# Patient Record
Sex: Female | Born: 1990
Health system: Southern US, Community
[De-identification: ages and names within clinical notes are randomized; demographics above are authoritative.]

## PROBLEM LIST (undated history)

## (undated) DIAGNOSIS — I10 Essential (primary) hypertension: Secondary | ICD-10-CM

## (undated) DIAGNOSIS — J45909 Unspecified asthma, uncomplicated: Secondary | ICD-10-CM

---

## 2018-12-25 ENCOUNTER — Other Ambulatory Visit: Payer: Self-pay | Admitting: Registered Nurse

## 2018-12-26 ENCOUNTER — Inpatient Hospital Stay (HOSPITAL_COMMUNITY)
Admission: AD | Admit: 2018-12-26 | Discharge: 2018-12-28 | DRG: 885 | Disposition: A | Discharge status: Home / Self Care | Payer: Medicaid Other | Source: Other Acute Inpatient Hospital | Attending: Psychiatry | Admitting: Psychiatry

## 2018-12-26 ENCOUNTER — Encounter (HOSPITAL_COMMUNITY): Payer: Self-pay

## 2018-12-26 ENCOUNTER — Other Ambulatory Visit: Payer: Self-pay

## 2018-12-26 ENCOUNTER — Other Ambulatory Visit: Payer: Self-pay | Admitting: Behavioral Health

## 2018-12-26 DIAGNOSIS — G4700 Insomnia, unspecified: Secondary | ICD-10-CM | POA: Diagnosis present

## 2018-12-26 DIAGNOSIS — I10 Essential (primary) hypertension: Secondary | ICD-10-CM | POA: Diagnosis present

## 2018-12-26 DIAGNOSIS — F17210 Nicotine dependence, cigarettes, uncomplicated: Secondary | ICD-10-CM | POA: Diagnosis present

## 2018-12-26 DIAGNOSIS — F200 Paranoid schizophrenia: Principal | ICD-10-CM | POA: Diagnosis present

## 2018-12-26 DIAGNOSIS — F259 Schizoaffective disorder, unspecified: Secondary | ICD-10-CM | POA: Diagnosis present

## 2018-12-26 MED ORDER — PANTOPRAZOLE SODIUM 40 MG PO TBEC
40.0000 mg | DELAYED_RELEASE_TABLET | Freq: Every day | ORAL | Status: DC
  Administered 2018-12-26 – 2018-12-28 (×3): 40 mg via ORAL
  Filled 2018-12-26 (×7): qty 1

## 2018-12-26 MED ORDER — RISPERIDONE 3 MG PO TABS
3.0000 mg | ORAL_TABLET | Freq: Two times a day (BID) | ORAL | Status: DC
  Administered 2018-12-26 – 2018-12-28 (×4): 3 mg via ORAL
  Filled 2018-12-26 (×10): qty 1

## 2018-12-26 MED ORDER — ZIPRASIDONE MESYLATE 20 MG IM SOLR
20.0000 mg | Freq: Once | INTRAMUSCULAR | Status: AC
  Administered 2018-12-26: 20 mg via INTRAMUSCULAR
  Filled 2018-12-26 (×2): qty 20

## 2018-12-26 MED ORDER — QUETIAPINE FUMARATE 100 MG PO TABS
100.0000 mg | ORAL_TABLET | Freq: Every day | ORAL | Status: DC
  Administered 2018-12-27: 100 mg via ORAL
  Filled 2018-12-26 (×4): qty 1

## 2018-12-26 MED ORDER — LORAZEPAM 2 MG/ML IJ SOLN
2.0000 mg | Freq: Once | INTRAMUSCULAR | Status: AC
  Administered 2018-12-26: 2 mg via INTRAMUSCULAR
  Filled 2018-12-26: qty 1

## 2018-12-26 MED ORDER — TEMAZEPAM 30 MG PO CAPS
30.0000 mg | ORAL_CAPSULE | Freq: Every day | ORAL | Status: DC
  Administered 2018-12-27: 30 mg via ORAL
  Filled 2018-12-26: qty 1

## 2018-12-26 MED ORDER — HYDROXYZINE HCL 25 MG PO TABS
25.0000 mg | ORAL_TABLET | ORAL | Status: DC | PRN
  Administered 2018-12-27: 25 mg via ORAL
  Filled 2018-12-26: qty 1

## 2018-12-26 MED ORDER — BENZTROPINE MESYLATE 0.5 MG PO TABS
0.5000 mg | ORAL_TABLET | Freq: Two times a day (BID) | ORAL | Status: DC
  Administered 2018-12-26 – 2018-12-28 (×4): 0.5 mg via ORAL
  Filled 2018-12-26 (×10): qty 1

## 2018-12-26 NOTE — [facility] Counselor (Signed)
CSW attempted to meet 1:1 with patient to complete PSA. Patient stated she was "done meeting with people and does not want anyone else to come in her room".

## 2018-12-26 NOTE — Tx Team (Signed)
Initial Treatment Plan 12/26/2018 2:58 PM Annette Hurley GYJ:[id number]    PATIENT STRESSORS: Substance abuse Traumatic event   PATIENT STRENGTHS: Average or above average intelligence Communication skills Supportive family/friends   PATIENT IDENTIFIED PROBLEMS: Hearing voices  paranoia  anxiety                 DISCHARGE CRITERIA:  Ability to meet basic life and health needs Improved stabilization in mood, thinking, and/or behavior Medical problems require only outpatient monitoring Motivation to continue treatment in a less acute level of care  PRELIMINARY DISCHARGE PLAN: Attend 12-step recovery group Outpatient therapy Return to previous living arrangement  PATIENT/FAMILY INVOLVEMENT: This treatment plan has been presented to and reviewed with the patient, Annette Hurley.  The patient and family have been given the opportunity to ask questions and make suggestions.  Baron Sane, RN 12/26/2018, 2:58 PM

## 2018-12-26 NOTE — H&P (Signed)
Psychiatric Admission Assessment Adult  Patient Identification: Annette Hurley  MRN:  [id number]  Date of Evaluation:  12/26/2018  Chief Complaint: Worsening paranoia.  Principal Diagnosis: Schizophrenia, paranoid ([place])  Diagnosis:  Principal Problem:   Schizophrenia, paranoid ([facility])  History of Present Illness: This is the first admission assessment in this [facility] for this 28 year old AA female with hx of chronic mental health issues. She  reports during this assessment that she is from [place] area prior to moving to [place]. Annette Hurley presents paranoid, suspicious, restricted affect & thought blocking episodes. During this assessment, [name] reports, "A couple of days ago or even yesterday, I ran to the hospital because I became paranoid. The reason was because I lied to my wife about a couple of things. She found out & said, "We can have a baby now". I have not been on my mental health medications in 7 months. I be feeling like I do not need the medicines sometime. I was on Geodon, Seroquel, acid reflux & Lorazepam. I will need my Seroquel to be able to sleep tonight & Geodon to help my thoughts. I take my medicines so that I can be normal. No trazodone.  Associated Signs/Symptoms:  Depression Symptoms:  depressed mood, insomnia, anxiety,  (Hypo) Manic Symptoms:  Labiality of Mood,  Anxiety Symptoms:  Excessive Worry,  Psychotic Symptoms:  Hallucinations: Auditory Visual Paranoia,  PTSD Symptoms: NA  Total Time spent with patient: 1 hour  Past Psychiatric History: Schizophrenia  Is the patient at risk to self? No.  Has the patient been a risk to self in the past 6 months? No.  Has the patient been a risk to self within the distant past? No.  Is the patient a risk to others? No.  Has the patient been a risk to others in the past 6 months? No.  Has the patient been a risk to others within the distant past? No.   Prior Inpatient Therapy: Yes (multiple times in [place]. Prior Outpatient Therapy: No  Alcohol Screening:    Substance Abuse History in the last 12 months:  Yes.    Consequences of Substance Abuse: Medical Consequences:  Liver damage, Possible death by overdose Legal Consequences:  Arrests, jail time, Loss of driving privilege. Family Consequences:  Family discord, divorce and or separation.  Previous Psychotropic Medications: Seroquel, Geodone, Klonopin  Psychological Evaluations: No   Past Medical History: History reviewed. No pertinent past medical history. History reviewed. No pertinent surgical history.  Family History: History reviewed. No pertinent family history.  Family Psychiatric  History: None reported  Tobacco Screening:    Social History:  Social History   Substance and Sexual Activity  Alcohol Use Not Currently     Social History   Substance and Sexual Activity  Drug Use Yes  . Types: Marijuana    Additional Social History:  Allergies:  Not on File Lab Results: No results found for this or any previous visit (from the past 48 hour(s)).  Blood Alcohol level:  No results found for: [facility]  Metabolic Disorder Labs:  No results found for: HGBA1C, MPG No results found for: PROLACTIN No results found for: CHOL, TRIG, HDL, CHOLHDL, VLDL, LDLCALC  Current Medications: No current facility-administered medications for this encounter.    PTA Medications: No medications prior to admission.   Musculoskeletal: Strength & Muscle Tone: within normal limits Gait & Station: normal Patient leans: N/A  Psychiatric Specialty Exam: Physical Exam  Vitals reviewed. Constitutional: She is  oriented to person, place, and time. She appears well-developed.  Eyes: Pupils are equal, round, and reactive to light.  Neck: Normal range of motion.  Cardiovascular: Normal rate.  Respiratory: Effort normal.  Genitourinary:    Genitourinary Comments: Deferred   Musculoskeletal: Normal range of motion.  Neurological:  She is alert and oriented to person, place, and time.  Skin: Skin is warm and dry.    Review of Systems  Constitutional: Negative for chills and fever.  Respiratory: Negative for cough, shortness of breath and wheezing.   Cardiovascular: Negative for chest pain and palpitations.  Gastrointestinal: Negative for heartburn, nausea and vomiting.  Neurological: Negative for dizziness and headaches.  Psychiatric/Behavioral: Positive for depression, hallucinations and substance abuse. Negative for memory loss and suicidal ideas. The patient is nervous/anxious and has insomnia.     Blood pressure 119/70, pulse 87, temperature 98 F (36.7 C), temperature source Oral, resp. rate 18, height 5\' 6"  (1.676 m), weight 90.7 kg, SpO2 100 %.Body mass index is 32.28 kg/m.  General Appearance: Fairly Groomed, presents paranoid  Eye Contact:  Fair  Speech:  Clear with some thought bloocking episodes  Volume:  Decreased  Mood:  Anxious and suspicious  Affect:  Blunt  Thought Process:  Disorganized and Descriptions of Associations: Tangential  Orientation:  Full (Time, Place, and Person)  Thought Content:  Hallucinations: Auditory Visual and Paranoid Ideation  Suicidal Thoughts:  Denies  Homicidal Thoughts:  Denies  Memory:  Immediate;   Fair Recent;   Fair Remote;   Fair  Judgement:  Impaired  Insight:  Lacking  Psychomotor Activity:  Decreased  Concentration:  Concentration: Fair and Attention Span: Fair  Recall:  [employer]  Fund of Knowledge:  Poor  Language:  Fair  Akathisia:  Negative  Handed:  Right  AIMS (if indicated):     Assets:  Desire for Improvement Physical Health Social Support  ADL's:  Intact  Cognition:  Impaired,  Mild  Sleep:      Treatment Plan Summary: Daily contact with patient to assess and evaluate symptoms and progress in treatment and Medication management.  Treatment Plan/Recommendations: 1. Admit for crisis management and stabilization, estimated length of stay 3-5  days.   2. Medication management to reduce current symptoms to base line and improve the patient's overall level of functioning: See MAR, Md's SRA & treatment plan.   Observation Level/Precautions:  15 minute checks  Laboratory:  Per ED  Psychotherapy: Group sessions  [facility]   Consultations: As needed.   Discharge Concerns:  Safety, mood stability.  Estimated LOS: 5-7 days  Other:  Admit to the [facility].   Physician Treatment Plan for Primary Diagnosis: Schizophrenia, paranoid (HCC)  Long Term Goal(s): Improvement in symptoms so as ready for discharge  Short Term Goals: Ability to identify changes in lifestyle to reduce recurrence of condition will improve, Ability to verbalize feelings will improve and Ability to demonstrate self-control will improve  Physician Treatment Plan for Secondary Diagnosis: Principal Problem:   Schizophrenia, paranoid (HCC)  Long Term Goal(s): Improvement in symptoms so as ready for discharge  Short Term Goals: Ability to identify and develop effective coping behaviors will improve, Compliance with prescribed medications will improve and Ability to identify triggers associated with substance abuse/mental health issues will improve  I certify that inpatient services furnished can reasonably be expected to improve the patient's condition.    Armandina StammerAgnes Nwoko, NP, PMHNP, FNP-BC 9/16/20203:25 PM

## 2018-12-26 NOTE — Progress Notes (Signed)
Pts wife came to visit on the request of the patient.  Pt did not give her the code number when on the phone earlier.  Writer talked to pt and she reported not wanting a visitor and she will call her when she is feeling better.  Writer asked if it was ok for staff to relay that information and she stated yes but did not agree to give her the code number.  Nurse Maretta Los was a witness with this Probation officer and heard pt state it was ok for staff to tell her wife that she will call when she is feeling better.  Relayed information to [place], wife, and she was upset and cursing while leaving the lobby.
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
## 2018-12-26 NOTE — Progress Notes (Signed)
Adult Psychoeducational Group Note  Date:  12/26/2018 Time:  8:33 PM  Group Topic/Focus:  Wrap-Up Group:   The focus of this group is to help patients review their daily goal of treatment and discuss progress on daily workbooks.  Participation Level:  Did Not Attend  Participation Quality:  Did not attend  Affect:  Did not attend  Cognitive:  Did not attend  Insight: None  Engagement in Group:  Did not attend  Modes of Intervention:  Did not attend  Additional Comments:  Patient did not attend wrap up group tonight.   Annette Hurley L Persephanie Laatsch 12/26/2018, 8:33 PM

## 2018-12-26 NOTE — Progress Notes (Signed)
Skin Assessment:  Patient has tattoo across upper chest, R upper, lower arm and R hand.    Old healed scars on L knee and legs.  New abrasion on R knee.

## 2018-12-27 NOTE — Progress Notes (Signed)
D: Pt denies SI/HI/VH, +ve AH- better. Pt is pleasant and cooperative. Pt visible on the unit a little while and interactive and did talk with Probation officer about her situation. Pt talked about moving to [place] from [place] to get fresh start and try reduce possible issues with life.  A: Pt was offered support and encouragement. Pt was given scheduled medications. Pt was encourage to attend groups. Q 15 minute checks were done for safety.  R:Pt attends groups and interacts  with peers and staff minimally . Pt is taking medication. Pt has no complaints.Pt receptive to treatment and safety maintained on unit.

## 2018-12-27 NOTE — Progress Notes (Signed)
Patient ID: Annette Hurley, female   DOB: Dec 27, 1990, 28 y.o.   MRN: [id number]  [place] NOVEL CORONAVIRUS (COVID-19) DAILY CHECK-OFF SYMPTOMS - answer yes or no to each - every day NO YES  Have you had a fever in the past 24 hours?  . Fever (Temp > 37.80C / 100F) X   Have you had any of these symptoms in the past 24 hours? . New Cough .  Sore Throat  .  Shortness of Breath .  Difficulty Breathing .  Unexplained Body Aches   X   Have you had any one of these symptoms in the past 24 hours not related to allergies?   . Runny Nose .  Nasal Congestion .  Sneezing   X   If you have had runny nose, nasal congestion, sneezing in the past 24 hours, has it worsened?  X   EXPOSURES - check yes or no X   Have you traveled outside the state in the past 14 days?  X   Have you been in contact with someone with a confirmed diagnosis of COVID-19 or PUI in the past 14 days without wearing appropriate PPE?  X   Have you been living in the same home as a person with confirmed diagnosis of COVID-19 or a PUI (household contact)?    X   Have you been diagnosed with COVID-19?    X              What to do next: Answered NO to all: Answered YES to anything:   Proceed with unit schedule Follow the [facility] Inpatient Flowsheet.

## 2018-12-27 NOTE — [facility] Suicide Risk Assessment (Signed)
[place] INPATIENT:  Family/Significant Other Suicide Prevention Education  Suicide Prevention Education:  Patient Refusal for Family/Significant Other Suicide Prevention Education: The patient Annette Hurley has refused to provide written consent for family/significant other to be provided Family/Significant Other Suicide Prevention Education during admission and/or prior to discharge.  Physician notified.  Joellen Jersey 12/27/2018, 11:22 AM

## 2018-12-27 NOTE — BHH Counselor (Signed)
Adult Comprehensive Assessment  Patient ID: Annette Hurley, female   DOB: November 13, 1990, 28 y.o.   MRN: [id number]  Information Source: Information source: Patient  Current Stressors:  Patient states their primary concerns and needs for treatment are:: "I have no idea" Patient presented under IVC, hearing voices. Patient states their goals for this hospitilization and ongoing recovery are:: "Go home," but she is not sure where she will be living at discharge. Educational / Learning stressors: Denies Employment / Job issues: Not working, she wasn't able to recall or provide employment history Family Relationships: Denies stressors, reports she will call her mother later today. She has a large family. Financial / Lack of resources (include bankruptcy): Not employed, has Medicaid. Housing / Lack of housing: Difficult to assess, was living with wife prior to admission, but she says she doesn't know if she can go home Physical health (include injuries & life threatening diseases): "Stomach ulcers." Social relationships: Married, but she isn't sure if her wife will let her come home. Substance abuse: "I don't do alcohol." Chart use indicates THC use. Bereavement / Loss: Denies  Living/Environment/Situation:  Living Arrangements: Spouse/significant other Living conditions (as described by patient or guardian): [place], [place] Who else lives in the home?: Wife How long has patient lived in current situation?: "Since late 2018." What is atmosphere in current home: (Unable to assess)  Family History:  Marital status: Married Number of Years Married: 1(Married November 2019) What types of issues is patient dealing with in the relationship?: Difficult to assess Additional relationship information: Not sure her wife will let her come home Are you sexually active?: Yes What is your sexual orientation?: didn't answer Has your sexual activity been affected by drugs, alcohol,  medication, or emotional stress?: didn't answer Does patient have children?: No  Childhood History:  By whom was/is the patient raised?: Other (Comment)("My whole family") Additional childhood history information: Reports she is from [place], reports she has a large family and could not specify how many siblings. Hasn't seen dad since she was 28 years old Description of patient's relationship with caregiver when they were a child: "Fine." Patient's description of current relationship with people who raised him/her: Both parents living, okay with mother, doesn't really talk to dad How were you disciplined when you got in trouble as a child/adolescent?: didn't answer Does patient have siblings?: Yes Description of patient's current relationship with siblings: "A lot of brothers and sisters." Did patient suffer any verbal/emotional/physical/sexual abuse as a child?: (didn't answer) Did patient suffer from severe childhood neglect?: (didn't answer) Has patient ever been sexually abused/assaulted/raped as an adolescent or adult?: (didn't answer) Was the patient ever a victim of a crime or a disaster?: (didn't answer) Witnessed domestic violence?: (didn't answer) Has patient been effected by domestic violence as an adult?: (didn't answer)  Education:  Highest grade of school patient has completed: 11th grade Currently a student?: No Learning disability?: No  Employment/Work Situation:   Employment situation: Unemployed What is the longest time patient has a held a job?: didn't answer Where was the patient employed at that time?: didn't answer Did You Receive Any Psychiatric Treatment/Services While in the [place] No Are There Guns or Other Weapons in Your Home?: No  Financial Resources:   Surveyor, quantityinancial resources: No income, Medicaid, Income from spouse Does patient have a Lawyerrepresentative payee or guardian?: No  Alcohol/Substance Abuse:   What has been your use of drugs/alcohol within the last  12 months?: THC Alcohol/Substance Abuse Treatment Hx:  Past Tx, Inpatient If yes, describe treatment: Prior mental health hospitalizations out of state, did not recall facility name or dates Has alcohol/substance abuse ever caused legal problems?: No  Social Support System:   Patient's Community Support System: Fair Astronomer System: Wife, mother Type of faith/religion: "I was raised Muslim." How does patient's faith help to cope with current illness?: unsure  Leisure/Recreation:   Leisure and Hobbies: "Just chill, laugh."  Strengths/Needs:   What is the patient's perception of their strengths?: "I like to eat." Patient states they can use these personal strengths during their treatment to contribute to their recovery: unsure  Discharge Plan:   Currently receiving community mental health services: No Patient states concerns and preferences for aftercare planning are: She is interested in outpatient therapy and medication management, but she isn't sure if she will return home with her wife or go live with her mother. Patient states they will know when they are safe and ready for discharge when: Not sure, she asked about leaving and criteria for discharge. Does patient have access to transportation?: No(Family may be able to transport her.) Does patient have financial barriers related to discharge medications?: No Patient description of barriers related to discharge medications: Has Medicaid. Plan for no access to transportation at discharge: Public transportation, Lyft/[employer] Will patient be returning to same living situation after discharge?: (Uncertain if she will return home with or wife or go live with her mom.)  Summary/Recommendations:   Summary and Recommendations (to be completed by the evaluator): Annette Hurley is a 28 year old female from [place], who presents to [facility] under IVC. IVC states patient was hearing voices. Patient states she is unsure why she is  hospitalized. Patient is a poor historian and presents as flat and guarded during the assessment. She provides short answers and cannot recall much information. She states she has previous hosptializations from out of state, but cannot recall when. She is receptive to being referred for outpatient therapy and medication management. Patient will benefit from crisis stabilization, medication management, therapeutic milieu, and referrals for services.  Joellen [place]. 12/27/2018

## 2018-12-27 NOTE — Progress Notes (Signed)
[facility]  12/27/2018 7:48 AM Annette Hurley  MRN:  [id number] Subjective:    Patient remains guarded, paranoid, and irritable and does not provide further new information.  She minimally answers the questions of the basic mental status exam.  States she did sleep "okay" denies auditory or visual hallucinations continues to behave in a paranoid fashion.  No involuntary movements. Still cites vague psychosocial stressors with partner but otherwise does not give me further information as to why she is here  Principal Problem: Schizophrenia, paranoid (HCC) Diagnosis: Principal Problem:   Schizophrenia, paranoid (HCC) Active Problems:   Schizoaffective disorder (HCC)  Total Time spent with patient: 20 minutes  Past Psychiatric History: No prior admissions here and reports numerous admissions in [place]  Past Medical History: History reviewed. No pertinent past medical history. History reviewed. No pertinent surgical history. Family History: History reviewed. No pertinent family history. Family Psychiatric  History: Did not answer Social History:  Social History   Substance and Sexual Activity  Alcohol Use Not Currently     Social History   Substance and Sexual Activity  Drug Use Yes  . Types: Marijuana    Social History   Socioeconomic History  . Marital status: Married    Spouse name: Not on file  . Number of children: Not on file  . Years of education: Not on file  . Highest education level: Not on file  Occupational History  . Not on file  Social Needs  . Financial resource strain: Not on file  . Food insecurity    Worry: Not on file    Inability: Not on file  . Transportation needs    Medical: Not on file    Non-medical: Not on file  Tobacco Use  . Smoking status: Current Every Day Smoker    Packs/day: 1.00    Types: Cigarettes  . Smokeless tobacco: Current User  Substance and Sexual Activity  . Alcohol use: Not Currently  . Drug use:  Yes    Types: Marijuana  . Sexual activity: Yes    Birth control/protection: None  Lifestyle  . Physical activity    Days per week: Not on file    Minutes per session: Not on file  . Stress: Not on file  Relationships  . Social Musicianconnections    Talks on phone: Not on file    Gets together: Not on file    Attends religious service: Not on file    Active member of club or organization: Not on file    Attends meetings of clubs or organizations: Not on file    Relationship status: Not on file  Other Topics Concern  . Not on file  Social History Narrative  . Not on file   Additional Social History:                         Sleep: Fair  Appetite:  Fair  Current Medications: Current Facility-Administered Medications  Medication Dose Route Frequency Provider Last Rate Last Dose  . benztropine (COGENTIN) tablet 0.5 mg  0.5 mg Oral BID Malvin JohnsFarah, Kallista Pae, MD   0.5 mg at 12/26/18 1800  . hydrOXYzine (ATARAX/VISTARIL) tablet 25 mg  25 mg Oral Q4H PRN Nwoko, Agnes I, NP      . pantoprazole (PROTONIX) EC tablet 40 mg  40 mg Oral Daily Nwoko, Agnes I, NP   40 mg at 12/26/18 1800  . QUEtiapine (SEROQUEL) tablet 100 mg  100 mg  Oral QHS Lindell Spar I, NP      . risperiDONE (RISPERDAL) tablet 3 mg  3 mg Oral BID Johnn Hai, MD   3 mg at 12/26/18 1800  . temazepam (RESTORIL) capsule 30 mg  30 mg Oral QHS Nwoko, Agnes I, NP        Lab Results: No results found for this or any previous visit (from the past 48 hour(s)).  Blood Alcohol level:  No results found for: [facility]  Metabolic Disorder Labs: No results found for: HGBA1C, MPG No results found for: PROLACTIN No results found for: CHOL, TRIG, HDL, CHOLHDL, VLDL, LDLCALC  Physical Findings: AIMS: Facial and Oral Movements Muscles of Facial Expression: None, normal Lips and Perioral Area: None, normal Jaw: None, normal Tongue: None, normal,Extremity Movements Upper (arms, wrists, hands, fingers): None, normal Lower (legs, knees,  ankles, toes): None, normal, Trunk Movements Neck, shoulders, hips: None, normal, Overall Severity Severity of abnormal movements (highest score from questions above): None, normal Incapacitation due to abnormal movements: None, normal Patient's awareness of abnormal movements (rate only patient's report): No Awareness, Dental Status Current problems with teeth and/or dentures?: No Does patient usually wear dentures?: No  CIWA:    COWS:     Musculoskeletal: Strength & Muscle Tone: within normal limits Gait & Station: normal Patient leans: N/A  Psychiatric Specialty Exam: Physical Exam  ROS  Blood pressure 119/70, pulse 87, temperature 98 F (36.7 C), temperature source Oral, resp. rate 18, height 5\' 6"  (1.676 m), weight 90.7 kg, SpO2 100 %.Body mass index is 32.28 kg/m.  General Appearance: Casual  Eye Contact:  None  Speech:  Slow  Volume:  Decreased  Mood:  Anxious and Dysphoric  Affect:  Blunt and Constricted  Thought Process:  Linear and Descriptions of Associations: Circumstantial  Orientation:  Full (Time, Place, and Person)  Thought Content:  Paranoid Ideation  Suicidal Thoughts:  No  Homicidal Thoughts:  No  Memory:  Immediate;   Poor Recent;   Fair  Judgement:  Poor  Insight:  Lacking and Shallow  Psychomotor Activity:  Decreased  Concentration:  Concentration: Poor  Recall:  Poor  Fund of Knowledge:  Poor  Language:  Poor  Akathisia:  Negative  Handed:  Right  AIMS (if indicated):     Assets:  Physical Health Resilience  ADL's:  Intact  Cognition:  WNL  Sleep:  Number of Hours: 6.75     Treatment Plan Summary: Daily contact with patient to assess and evaluate symptoms and progress in treatment and Medication management  For psychotic disorder continue reality based therapy and to continue risperidone therapy she did not speak enthusiastically about Geodon success so we will avoid that 1.  Continue to monitor under 15-minute precautions she will not  answer as to whether she plans to stay in this area or go back to [place]  Annette Welling, MD 12/27/2018, 7:48 AM

## 2018-12-27 NOTE — Progress Notes (Signed)
Recreation Therapy Notes  Date:  9.17.20 Time: 1000 Location: 500 Hall Dayroom  Group Topic: Coping Skills  Goal Area(s) Addresses:  Patient will identify positive coping skills. Patient will identify barriers to using positive coping skills.  Intervention:  Worksheet, pencils  Activity: Healthy vs. Unhealthy Coping Strategies.  Patient were to identify unhealthy coping strategies and consequences to those strategies.  Patients would then identify positive coping strategies, outcomes and barriers to using those positive coping strategies.  Education:Coping Skills, Discharge Planning.   Education Outcome: Acknowledges understanding/In group clarification offered/Needs additional education.   Clinical Observations/Feedback:  Pt did not attend group.       [name], LRT/CTRS         [name] A 12/27/2018 12:27 PM 

## 2018-12-27 NOTE — Plan of Care (Signed)
  Problem: Education: Goal: Emotional status will improve Outcome: Progressing Goal: Verbalization of understanding the information provided will improve Outcome: Progressing   Problem: Coping: Goal: Ability to verbalize frustrations and anger appropriately will improve Outcome: Progressing Goal: Ability to demonstrate self-control will improve Outcome: Progressing   

## 2018-12-27 NOTE — Progress Notes (Signed)
Recreation Therapy Notes  INPATIENT RECREATION THERAPY ASSESSMENT  Patient Details Name: Annette Hurley MRN: [id number] DOB: Apr 27, 1990 Today's Date: 12/27/2018       Information Obtained From: Patient  Able to Participate in Assessment/Interview: Yes  Patient Presentation: Alert  Reason for Admission (Per Patient): Patient Unable to Identify  Patient Stressors: (None identified)  Coping Skills:   TV, Sports, Music, Deep Breathing, Meditate, Substance Abuse, Art, Avoidance, Read, Hot Bath/Shower  Leisure Interests (2+):  Social - Friends, Individual - Other (Comment)(Chill)  Frequency of Recreation/Participation: Other (Comment)(Daily)  Awareness of Community Resources:  Yes  Community Resources:  Park, Patent examiner, Art therapist  Current Use: Yes  If no, Barriers?:    Expressed Interest in [place]: No  [employer] of Residence:  Lobbyist  Patient Main Form of Transportation: Musician  Patient Strengths:  Conservator, museum/gallery self  Patient Identified Areas of Improvement:  "I don't know"  Patient Goal for Hospitalization:  "go to groups so I can go home"  Current SI (including self-harm):  No  Current HI:  No  Current AVH: No  Staff Intervention Plan: Group Attendance, Collaborate with Interdisciplinary Treatment Team  Consent to Intern Participation: N/A    Victorino Sparrow, LRT/CTRS  Victorino Sparrow A 12/27/2018, 12:33 PM

## 2018-12-27 NOTE — BHH Group Notes (Signed)
LCSW Aftercare Discharge Planning Group Note  12/27/2018 8:45am  Type of Group and Topic: Psychoeducational Group: Discharge Planning  Participation Level: Did Not Attend  Description of Group  Discharge planning group reviews patient's anticipated discharge plans and assists patients to anticipate and address any barriers to wellness/recovery in the community. Suicide prevention education is reviewed with patients in group.  Therapeutic Goals  1. Patients will state their anticipated discharge plan and mental health aftercare  2. Patients will identify potential barriers to wellness in the community setting  3. Patients will engage in problem solving, solution focused discussion of ways to anticipate and address barriers to wellness/recovery  Summary of Patient Progress  Plan for Discharge/Comments:  Transportation Means:  Supports:  Therapeutic Modalities:  [place], [place]  12/27/2018 2:37 PM

## 2018-12-28 MED ORDER — RISPERIDONE 3 MG PO TABS: 3.0000 mg | ORAL_TABLET | Freq: Two times a day (BID) | ORAL | 2 refills | Status: DC

## 2018-12-28 MED ORDER — AMLODIPINE BESYLATE 10 MG PO TABS: 10.0000 mg | ORAL_TABLET | Freq: Every day | ORAL | 1 refills | Status: DC

## 2018-12-28 MED ORDER — QUETIAPINE FUMARATE 100 MG PO TABS: 100.0000 mg | ORAL_TABLET | Freq: Every day | ORAL | 1 refills | Status: DC

## 2018-12-28 MED ORDER — BENZTROPINE MESYLATE 0.5 MG PO TABS: 0.5000 mg | ORAL_TABLET | Freq: Two times a day (BID) | ORAL | 2 refills | Status: DC

## 2018-12-28 MED ORDER — AMLODIPINE BESYLATE 10 MG PO TABS
10.0000 mg | ORAL_TABLET | Freq: Every day | ORAL | Status: DC
  Administered 2018-12-28: 10 mg via ORAL
  Filled 2018-12-28: qty 2
  Filled 2018-12-28 (×3): qty 1

## 2018-12-28 NOTE — Tx Team (Signed)
Interdisciplinary Treatment and Diagnostic Plan Update  12/28/2018 Time of Session: 9:00am Annette Hurley MRN: [id number]  Principal Diagnosis: Schizophrenia, paranoid ([place])  Secondary Diagnoses: Principal Problem:   Schizophrenia, paranoid ([place]) Active Problems:   Schizoaffective disorder ([place])   Current Medications:  Current Facility-Administered Medications  Medication Dose Route Frequency Provider Last Rate Last Dose  . amLODipine (NORVASC) tablet 10 mg  10 mg Oral Daily Johnn Hai, MD   10 mg at 12/28/18 0802  . benztropine (COGENTIN) tablet 0.5 mg  0.5 mg Oral BID Johnn Hai, MD   0.5 mg at 12/28/18 0757  . hydrOXYzine (ATARAX/VISTARIL) tablet 25 mg  25 mg Oral Q4H PRN Lindell Spar I, NP   25 mg at 12/27/18 1122  . pantoprazole (PROTONIX) EC tablet 40 mg  40 mg Oral Daily Lindell Spar I, NP   40 mg at 12/28/18 0757  . QUEtiapine (SEROQUEL) tablet 100 mg  100 mg Oral QHS Lindell Spar I, NP   100 mg at 12/27/18 2102  . risperiDONE (RISPERDAL) tablet 3 mg  3 mg Oral BID Johnn Hai, MD   3 mg at 12/28/18 0757  . temazepam (RESTORIL) capsule 30 mg  30 mg Oral QHS Nwoko, Herbert Pun I, NP   30 mg at 12/27/18 2102   PTA Medications: Medications Prior to Admission  Medication Sig Dispense Refill Last Dose  . LORAZEPAM PO Take by mouth. Dose and frequency unknown     . PANTOPRAZOLE SODIUM PO Take by mouth. Dose and frequency unknown     . QUEtiapine Fumarate (SEROQUEL PO) Take by mouth. Dose and frequency unknown     . ZIPRASIDONE HCL PO Take by mouth. Dose and frequency unknown       Patient Stressors: Substance abuse Traumatic event  Patient Strengths: Average or above average intelligence Communication skills Supportive family/friends  Treatment Modalities: Medication Management, Group therapy, Case management,  1 to 1 session with clinician, Psychoeducation, Recreational therapy.   Physician Treatment Plan for Primary Diagnosis: Schizophrenia, paranoid ([place]) Long  Term Goal(s): Improvement in symptoms so as ready for discharge Improvement in symptoms so as ready for discharge   Short Term Goals: Ability to identify changes in lifestyle to reduce recurrence of condition will improve Ability to verbalize feelings will improve Ability to demonstrate self-control will improve Ability to identify and develop effective coping behaviors will improve Compliance with prescribed medications will improve Ability to identify triggers associated with substance abuse/mental health issues will improve  Medication Management: Evaluate patient's response, side effects, and tolerance of medication regimen.  Therapeutic Interventions: 1 to 1 sessions, Unit Group sessions and Medication administration.  Evaluation of Outcomes: Adequate for Discharge  Physician Treatment Plan for Secondary Diagnosis: Principal Problem:   Schizophrenia, paranoid ([place]) Active Problems:   Schizoaffective disorder ([place])  Long Term Goal(s): Improvement in symptoms so as ready for discharge Improvement in symptoms so as ready for discharge   Short Term Goals: Ability to identify changes in lifestyle to reduce recurrence of condition will improve Ability to verbalize feelings will improve Ability to demonstrate self-control will improve Ability to identify and develop effective coping behaviors will improve Compliance with prescribed medications will improve Ability to identify triggers associated with substance abuse/mental health issues will improve     Medication Management: Evaluate patient's response, side effects, and tolerance of medication regimen.  Therapeutic Interventions: 1 to 1 sessions, Unit Group sessions and Medication administration.  Evaluation of Outcomes: Adequate for Discharge   RN Treatment Plan for Primary Diagnosis: Schizophrenia, paranoid (  HCC) Long Term Goal(s): Knowledge of disease and therapeutic regimen to maintain health will improve  Short Term  Goals: Ability to verbalize frustration and anger appropriately will improve, Ability to verbalize feelings will improve, Ability to identify and develop effective coping behaviors will improve and Compliance with prescribed medications will improve  Medication Management: RN will administer medications as ordered by provider, will assess and evaluate patient's response and provide education to patient for prescribed medication. RN will report any adverse and/or side effects to prescribing provider.  Therapeutic Interventions: 1 on 1 counseling sessions, Psychoeducation, Medication administration, Evaluate responses to treatment, Monitor vital signs and CBGs as ordered, Perform/monitor CIWA, COWS, AIMS and Fall Risk screenings as ordered, Perform wound care treatments as ordered.  Evaluation of Outcomes: Adequate for Discharge   LCSW Treatment Plan for Primary Diagnosis: Schizophrenia, paranoid (HCC) Long Term Goal(s): Safe transition to appropriate next level of care at discharge, Engage patient in therapeutic group addressing interpersonal concerns.  Short Term Goals: Engage patient in aftercare planning with referrals and resources, Increase social support, Increase emotional regulation, Identify triggers associated with mental health/substance abuse issues and Increase skills for wellness and recovery  Therapeutic Interventions: Assess for all discharge needs, 1 to 1 time with Social worker, Explore available resources and support systems, Assess for adequacy in community support network, Educate family and significant other(s) on suicide prevention, Complete Psychosocial Assessment, Interpersonal group therapy.  Evaluation of Outcomes: Adequate for Discharge   Progress in Treatment: Attending groups: No. Participating in groups: No. Taking medication as prescribed: Yes. Toleration medication: Yes. Family/Significant other contact made: No, will contact:  declined consents, SPE reviewed  with patient. Patient understands diagnosis: Yes. Discussing patient identified problems/goals with staff: No. Medical problems stabilized or resolved: Yes. Denies suicidal/homicidal ideation: Yes. Issues/concerns per patient self-inventory: No.   New problem(s) identified: Yes, Describe:  relationship conflict  New Short Term/Long Term Goal(s): medication management for mood stabilization; elimination of SI thoughts; development of comprehensive mental wellness/sobriety plan.  Patient Goals: "Go home."  Discharge Plan or Barriers: Returning home, [facility]  Reason for Continuation of Hospitalization: Anxiety Medication stabilization  Estimated Length of Stay: discharging today  Attendees: Patient: Annette Hurley 12/28/2018 9:30 AM  Physician: Dr.Farah 12/28/2018 9:30 AM  Nursing: Annette KickJan, RN 12/28/2018 9:30 AM  RN Care Manager: 12/28/2018 9:30 AM  Social Worker: Annette Hurley, LCSWA 12/28/2018 9:30 AM  Recreational Therapist:  12/28/2018 9:30 AM  Other:  12/28/2018 9:30 AM  Other:  12/28/2018 9:30 AM  Other: 12/28/2018 9:30 AM    Scribe for Treatment Team: Annette Hurley, LCSWA 12/28/2018 9:30 AM

## 2018-12-28 NOTE — Progress Notes (Signed)
  [facility] Adult Case Management Discharge Plan :  Will you be returning to the same living situation after discharge:  Yes,  home At discharge, do you have transportation home?: Yes,  wife picking up Do you have the ability to pay for your medications: Yes,  Medicaid.  Release of information consent forms completed and in the chart. Patient to Follow up at: Follow-up [place] Follow up on 12/31/2018.   Why: Hospital discharge appointment is Monday, 9/21 at 8:15a.  Please bring your photo ID, insurance card, and current medications.  Contact information: [place] [address] [phone]           Next level of care provider has access to [place] and Suicide Prevention discussed: Yes,  with patient.   Has patient been referred to the Quitline?: Patient refused referral  Patient has been referred for addiction treatment: Yes  Joellen [place], [place] 12/28/2018, 11:08 AM

## 2018-12-28 NOTE — B[facility] Suicide Risk Assessment (Signed)
[facility] Discharge Suicide Risk Assessment   Principal Problem: Schizophrenia, paranoid ([place]) Discharge Diagnoses: Principal Problem:   Schizophrenia, paranoid ([place]) Active Problems:   Schizoaffective disorder ([place])   Total Time spent with patient: 45 minutes  Musculoskeletal: Strength & Muscle Tone: within normal limits Gait & Station: normal Patient leans: N/A  Psychiatric Specialty Exam: ROS  Blood pressure (!) 149/138, pulse (!) 137, temperature 97.7 F (36.5 C), temperature source Oral, resp. rate 18, height 5\' 6"  (1.676 m), weight 90.7 kg, SpO2 100 %.Body mass index is 32.28 kg/m.  General Appearance: Casual  Eye Contact::  Good  Speech:  Clear and Coherent409  Volume:  Normal  Mood:  Euthymic  Affect:  Constricted  Thought Process:  Coherent and Descriptions of Associations: Circumstantial  Orientation:  Full (Time, Place, and Person)  Thought Content:  Logical  Suicidal Thoughts:  No  Homicidal Thoughts:  No  Memory:  Immediate;   Good Recent;   Good  Judgement:  Good  Insight:  Good  Psychomotor Activity:  Normal  Concentration:  Good  Recall:  Good  Fund of Knowledge:Good  Language: Good  Akathisia:  Negative  Handed:  Right  AIMS (if indicated):     Assets:  Communication Skills Desire for Improvement  Sleep:  Number of Hours: 6.5  Cognition: WNL  ADL's:  Intact   Mental Status Per Nursing Assessment::   On Admission:  NA  Demographic Factors:  Gay, lesbian, or bisexual orientation  Loss Factors: Decrease in vocational status  Historical Factors: NA  Risk Reduction Factors:   Living with another person, especially a relative and Positive social support  Continued Clinical Symptoms:  Previous Psychiatric Diagnoses and Treatments  Cognitive Features That Contribute To Risk:  None    Suicide Risk:  Minimal: No identifiable suicidal ideation.  Patients presenting with no risk factors but with morbid ruminations; may be classified as minimal  risk based on the severity of the depressive symptoms    Plan Of Care/Follow-up recommendations:  Activity:  full  Amarii Bordas, MD 12/28/2018, 7:55 AM

## 2018-12-28 NOTE — Discharge Summary (Signed)
Physician Discharge Summary Note  Patient:  Annette Hurley is an 28 y.o., female MRN:  [id number] DOB:  1990/11/17 Patient phone:  [phone] (home)  Patient address:   [address] [place] [address],  Total Time spent with patient: 45 minutes  Date of Admission:  12/26/2018 Date of Discharge: 12/28/2018  Reason for Admission:    This was the first admission here but the latest of numerous for Annette Hurley 28 year old individual who reported that she had been off of her psychotropic medications for about 6 months, she present with paranoia, guarded behavior poor cooperation and irritability, according to admission note  History of Present Illness: This is the first admission assessment in this [facility] for this 28 year old AA female with hx of chronic mental health issues. She  reports during this assessment that she is from [place] area prior to moving to [place]. Annette Hurley presents paranoid, suspicious, restricted affect & thought blocking episodes. During this assessment, Annette Hurley reports, "A couple of days ago or even yesterday, I ran to the hospital because I became paranoid. The reason was because I lied to my wife about a couple of things. She found out & said, "We can have a baby now". I have not been on my mental health medications in 7 months. I be feeling like I do not need the medicines sometime. I was on Geodon, Seroquel, acid reflux & Lorazepam. I will need my Seroquel to be able to sleep tonight & Geodon to help my thoughts. I take my medicines so that I can be normal. No trazodone  Principal Problem: Schizophrenia, paranoid [address]) Discharge Diagnoses: Principal Problem:   Schizophrenia, paranoid ([place]) Active Problems:   Schizoaffective disorder ([place])   Past Psychiatric History: Reported multiple hospitalizations in [place]  Past Medical History: History reviewed. No pertinent past medical history. History reviewed. No pertinent surgical history. Family History:  History reviewed. No pertinent family history. Family Psychiatric  History: Denied -reported her mother was schizophrenic Social History:  Social History   Substance and Sexual Activity  Alcohol Use Not Currently     Social History   Substance and Sexual Activity  Drug Use Yes  . Types: Marijuana    Social History   Socioeconomic History  . Marital status: Married    Spouse name: Not on file  . Number of children: Not on file  . Years of education: Not on file  . Highest education level: Not on file  Occupational History  . Not on file  Social Needs  . Financial resource strain: Not on file  . Food insecurity    Worry: Not on file    Inability: Not on file  . Transportation needs    Medical: Not on file    Non-medical: Not on file  Tobacco Use  . Smoking status: Current Every Day Smoker    Packs/day: 1.00    Types: Cigarettes  . Smokeless tobacco: Current User  Substance and Sexual Activity  . Alcohol use: Not Currently  . Drug use: Yes    Types: Marijuana  . Sexual activity: Yes    Birth control/protection: None  Lifestyle  . Physical activity    Days per week: Not on file    Minutes per session: Not on file  . Stress: Not on file  Relationships  . Social Herbalist on phone: Not on file    Gets together: Not on file    Attends religious service: Not on file  Active member of club or organization: Not on file    Attends meetings of clubs or organizations: Not on file    Relationship status: Not on file  Other Topics Concern  . Not on file  Social History Narrative  . Not on file    Hospital Course:    As discussed patient was initially guarded somewhat irritable and less cooperative than optimal but she displayed no dangerous behaviors here.  She tended to stay to herself.  She was continued on quetiapine at her request but we felt that Geodon was not going to be efficacious and gave her Risperdal, and it was a pretty standard dose she had  no EPS or TD.  By the 18th she was much improved she can make eye contact was conversant denied auditory and visual hallucinations, denied thoughts of harming self or others and stated she felt like she was at her baseline status she had spoken with her partner the night before.  She felt she was ready to go home so I could not argue with her she was pretty clear as far as mental status exam without any acute positive symptoms. No acute dangerousness  Physical Findings: AIMS: Facial and Oral Movements Muscles of Facial Expression: None, normal Lips and Perioral Area: None, normal Jaw: None, normal Tongue: None, normal,Extremity Movements Upper (arms, wrists, hands, fingers): None, normal Lower (legs, knees, ankles, toes): None, normal, Trunk Movements Neck, shoulders, hips: None, normal, Overall Severity Severity of abnormal movements (highest score from questions above): None, normal Incapacitation due to abnormal movements: None, normal Patient's awareness of abnormal movements (rate only patient's report): No Awareness, Dental Status Current problems with teeth and/or dentures?: No Does patient usually wear dentures?: No  CIWA:    COWS:      Musculoskeletal: Strength & Muscle Tone: within normal limits Gait & Station: normal Patient leans: N/A  Psychiatric Specialty Exam: ROS  Blood pressure (!) 149/138, pulse (!) 137, temperature 97.7 F (36.5 C), temperature source Oral, resp. rate 18, height 5\' 6"  (1.676 m), weight 90.7 kg, SpO2 100 %.Body mass index is 32.28 kg/m.  General Appearance: Casual  Eye Contact::  Good  Speech:  Clear and Coherent409  Volume:  Normal  Mood:  Euthymic  Affect:  Constricted  Thought Process:  Coherent and Descriptions of Associations: Circumstantial  Orientation:  Full (Time, Place, and Person)  Thought Content:  Logical  Suicidal Thoughts:  No  Homicidal Thoughts:  No  Memory:  Immediate;   Good Recent;   Good  Judgement:  Good   Insight:  Good  Psychomotor Activity:  Normal  Concentration:  Good  Recall:  Good  Fund of Knowledge:Good  Language: Good  Akathisia:  Negative  Handed:  Right  AIMS (if indicated):     Assets:  Communication Skills Desire for Improvement  Sleep:  Number of Hours: 6.5  Cognition: WNL  ADL's:  Intact        Has this patient used any form of tobacco in the last 30 days? (Cigarettes, Smokeless Tobacco, Cigars, and/or Pipes) Yes, No  Blood Alcohol level:  No results found for: [facility]  Metabolic Disorder Labs:  No results found for: HGBA1C, MPG No results found for: PROLACTIN No results found for: CHOL, TRIG, HDL, CHOLHDL, VLDL, LDLCALC  See Psychiatric Specialty Exam and Suicide Risk Assessment completed by Attending Physician prior to discharge.  Discharge destination:  Home  Is patient on multiple antipsychotic therapies at discharge:  No   Has Patient had three  or more failed trials of antipsychotic monotherapy by history:  No  Recommended Plan for Multiple Antipsychotic Therapies: NA   Allergies as of 12/28/2018   No Known Allergies     Medication List    STOP taking these medications   LORAZEPAM PO   ZIPRASIDONE HCL PO     TAKE these medications     Indication  amLODipine 10 MG tablet Commonly known as: NORVASC Take 1 tablet (10 mg total) by mouth daily.  Indication: High Blood Pressure Disorder   benztropine 0.5 MG tablet Commonly known as: COGENTIN Take 1 tablet (0.5 mg total) by mouth 2 (two) times daily.  Indication: Extrapyramidal Reaction caused by Medications   PANTOPRAZOLE SODIUM PO Take by mouth. Dose and frequency unknown  Indication: 21-Hydroxylase Deficiency   QUEtiapine 100 MG tablet Commonly known as: SEROQUEL Take 1 tablet (100 mg total) by mouth at bedtime. What changed:   medication strength  how much to take  when to take this  additional instructions  Indication: Schizophrenia, Mood control   risperiDONE 3 MG  tablet Commonly known as: RISPERDAL Take 1 tablet (3 mg total) by mouth 2 (two) times daily.  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

## 2018-12-28 NOTE — Progress Notes (Signed)
Recreation Therapy Notes  INPATIENT RECREATION TR PLAN  Patient Details Name: Annette Hurley MRN: [id number] DOB: [birth year] Today's Date: 12/28/2018  Rec Therapy Plan Is patient appropriate for Therapeutic Recreation?: Yes Treatment times per week: about 3 days Estimated Length of Stay: 5-7 days TR Treatment/Interventions: Group participation (Comment)  Discharge Criteria Pt will be discharged from therapy if:: Discharged Treatment plan/goals/alternatives discussed and agreed upon by:: Patient/family  Discharge Summary Short term goals set: See patient care plan Short term goals met: Adequate for discharge Progress toward goals comments: Groups attended Which groups?: Other (Comment)(Team building) Reason goals not met: Pt discharging Therapeutic equipment acquired: N/A Reason patient discharged from therapy: Discharge from hospital Pt/family agrees with progress & goals achieved: Yes Date patient discharged from therapy: 12/28/18    Victorino Sparrow, LRT/CTRS  Ria Comment, Evellyn Tuff A 12/28/2018, 11:22 AM

## 2018-12-28 NOTE — Progress Notes (Signed)
Recreation Therapy Notes  Date: 9.18.20 Time: 1000 Location: 500 Hall Day Room  Group Topic: Communication, Team Building, Problem Solving  Goal Area(s) Addresses:  Patient will effectively work with peer towards shared goal.  Patient will identify skill used to make activity successful.  Patient will identify how skills used during activity can be used to reach post d/c goals.   Behavioral Response:  Engaged  Intervention: STEM Activity   Activity:  Metallurgist.  In groups, patients were given 12 pipe cleaner.  Patients were to build a free standing tower as tall as they could get it.  Throughout the course of the activity, there will be budget cuts and patients will lose the use of one hand and the ability to communicate with each other.  Once funding is restored, patients will gradually add back the use of their hands and ability to communicate.  Education: Education officer, community, Dentist.   Education Outcome: Acknowledges education  Clinical Observations/Feedback:  Pt worked a lone.  Pt was social with LRT at times but was quiet for most of the group.  Pt was pleasant during group.    Victorino Sparrow, LRT/CTRS    Ria Comment, Zo Loudon A 12/28/2018 11:00 AM

## 2018-12-28 NOTE — Plan of Care (Signed)
Pt attended and participated in one recreation therapy group session.   Victorino Sparrow, LRT/CTRS

## 2018-12-28 NOTE — Progress Notes (Signed)
Pt d/c from the hospital with her wife. All items returned. D/C instructions given and prescriptions given. Pt denies si and hi.

## 2020-03-12 ENCOUNTER — Emergency Department (HOSPITAL_COMMUNITY)
Admission: EM | Admit: 2020-03-12 | Discharge: 2020-03-13 | Discharge status: Against Medical Advice | Payer: Medicaid Other | Attending: Emergency Medicine | Admitting: Emergency Medicine

## 2020-03-12 ENCOUNTER — Other Ambulatory Visit: Payer: Self-pay

## 2020-03-12 ENCOUNTER — Emergency Department (HOSPITAL_COMMUNITY): Payer: Medicaid Other

## 2020-03-12 ENCOUNTER — Encounter (HOSPITAL_COMMUNITY): Payer: Self-pay | Admitting: Emergency Medicine

## 2020-03-12 DIAGNOSIS — S01512A Laceration without foreign body of oral cavity, initial encounter: Secondary | ICD-10-CM | POA: Diagnosis not present

## 2020-03-12 DIAGNOSIS — T1490XA Injury, unspecified, initial encounter: Secondary | ICD-10-CM

## 2020-03-12 DIAGNOSIS — N63 Unspecified lump in unspecified breast: Secondary | ICD-10-CM | POA: Diagnosis not present

## 2020-03-12 DIAGNOSIS — R079 Chest pain, unspecified: Secondary | ICD-10-CM | POA: Diagnosis present

## 2020-03-12 DIAGNOSIS — Y92410 Unspecified street and highway as the place of occurrence of the external cause: Secondary | ICD-10-CM | POA: Diagnosis not present

## 2020-03-12 DIAGNOSIS — R0782 Intercostal pain: Secondary | ICD-10-CM | POA: Insufficient documentation

## 2020-03-12 DIAGNOSIS — M5127 Other intervertebral disc displacement, lumbosacral region: Secondary | ICD-10-CM | POA: Diagnosis not present

## 2020-03-12 DIAGNOSIS — Z79899 Other long term (current) drug therapy: Secondary | ICD-10-CM | POA: Insufficient documentation

## 2020-03-12 DIAGNOSIS — F17210 Nicotine dependence, cigarettes, uncomplicated: Secondary | ICD-10-CM | POA: Diagnosis not present

## 2020-03-12 DIAGNOSIS — M5126 Other intervertebral disc displacement, lumbar region: Secondary | ICD-10-CM

## 2020-03-12 DIAGNOSIS — I10 Essential (primary) hypertension: Secondary | ICD-10-CM | POA: Diagnosis not present

## 2020-03-12 DIAGNOSIS — S0990XA Unspecified injury of head, initial encounter: Secondary | ICD-10-CM | POA: Insufficient documentation

## 2020-03-12 DIAGNOSIS — R0781 Pleurodynia: Secondary | ICD-10-CM

## 2020-03-12 DIAGNOSIS — R52 Pain, unspecified: Secondary | ICD-10-CM

## 2020-03-12 DIAGNOSIS — J45909 Unspecified asthma, uncomplicated: Secondary | ICD-10-CM | POA: Insufficient documentation

## 2020-03-12 HISTORY — DX: Unspecified asthma, uncomplicated: J45.909

## 2020-03-12 HISTORY — DX: Essential (primary) hypertension: I10

## 2020-03-12 LAB — I-STAT CHEM 8, ED
BUN: 11 mg/dL (ref 6–20)
Calcium, Ion: 1.13 mmol/L — ABNORMAL LOW (ref 1.15–1.40)
Chloride: 104 mmol/L (ref 98–111)
Creatinine, Ser: 0.8 mg/dL (ref 0.44–1.00)
Glucose, Bld: 97 mg/dL (ref 70–99)
HCT: 45 % (ref 36.0–46.0)
Hemoglobin: 15.3 g/dL — ABNORMAL HIGH (ref 12.0–15.0)
Potassium: 4 mmol/L (ref 3.5–5.1)
Sodium: 138 mmol/L (ref 135–145)
TCO2: 23 mmol/L (ref 22–32)

## 2020-03-12 LAB — I-STAT BETA HCG BLOOD, ED (MC, WL, AP ONLY): I-stat hCG, quantitative: <5 m[IU]/mL (ref <5)

## 2020-03-12 MED ORDER — AMOXICILLIN-POT CLAVULANATE 875-125 MG PO TABS: 1.0000 | ORAL_TABLET | Freq: Two times a day (BID) | ORAL | 0 refills | Status: DC

## 2020-03-12 MED ORDER — OXYCODONE-ACETAMINOPHEN 5-325 MG PO TABS
1.0000 | ORAL_TABLET | Freq: Once | ORAL | Status: AC
  Administered 2020-03-12: 1 via ORAL
  Filled 2020-03-12: qty 1

## 2020-03-12 MED ORDER — IOHEXOL 300 MG/ML  SOLN
100.0000 mL | Freq: Once | INTRAMUSCULAR | Status: AC | PRN
  Administered 2020-03-12: 100 mL via INTRAVENOUS
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
## 2020-03-12 NOTE — Discharge Instructions (Addendum)
We recommended that you get an MRI as your CT scan showed that you may have a fracture in your cervical spine however you declined this imaging study.  If you change your mind then please come back to the emergency department for reassessment.  Because you declined this imaging study we are recommending that you keep on your cervical collar to help protect your spine and your spinal cord.  We also gave you a referral to a back doctor.  You will need to call the office schedule an appointment for follow-up within the next week.  Please pick up pain medication prescribed during your ED visit at [facility]. Take as needed.   I have prescribed antibiotics for your tongue laceration to prevent any type of infection. Take your printed prescription to a pharmacy of your choosing to get it filled. I would recommend salt water gargles to help with healing and eating softer foods.   Please follow up with [facility] given you had an incidental finding of an irregularity in your left breast on imaging  The CT scans also showed a possible fracture of the cartilage connecting your ribs to your sternal bone. This will heal in time with rest. I have also provided you with an incentive spirometer - use as indicated every hour to help prevent an infection in your lungs.   Follow up with your PCP regarding your ED visit. If you do not have one you can follow up with [facility] for primary care needs. Call to schedule an appointment  Return to the ED for any worsening symptoms
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
## 2020-03-13 ENCOUNTER — Other Ambulatory Visit: Payer: Self-pay | Admitting: *Deleted

## 2020-03-13 DIAGNOSIS — R5381 Other malaise: Secondary | ICD-10-CM

## 2020-03-13 LAB — TROPONIN I (HIGH SENSITIVITY)
Troponin I (High Sensitivity): <2 ng/L (ref <18)
Troponin I (High Sensitivity): <2 ng/L (ref <18)
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
## 2020-04-08 ENCOUNTER — Other Ambulatory Visit: Payer: Self-pay | Admitting: Physician Assistant

## 2020-04-08 ENCOUNTER — Encounter: Payer: Self-pay | Admitting: Physician Assistant

## 2020-04-08 ENCOUNTER — Other Ambulatory Visit: Payer: Self-pay

## 2020-04-08 ENCOUNTER — Ambulatory Visit: Payer: No Typology Code available for payment source | Attending: Physician Assistant | Admitting: Physician Assistant

## 2020-04-08 DIAGNOSIS — M62838 Other muscle spasm: Secondary | ICD-10-CM

## 2020-04-08 DIAGNOSIS — Z09 Encounter for follow-up examination after completed treatment for conditions other than malignant neoplasm: Secondary | ICD-10-CM

## 2020-04-08 DIAGNOSIS — R634 Abnormal weight loss: Secondary | ICD-10-CM

## 2020-04-08 DIAGNOSIS — S20219D Contusion of unspecified front wall of thorax, subsequent encounter: Secondary | ICD-10-CM | POA: Diagnosis not present

## 2020-04-08 MED ORDER — NAPROXEN 500 MG PO TABS: 500.0000 mg | ORAL_TABLET | Freq: Two times a day (BID) | ORAL | 1 refills | Status: DC

## 2020-04-08 MED ORDER — METHOCARBAMOL 500 MG PO TABS: 1000.0000 mg | ORAL_TABLET | Freq: Three times a day (TID) | ORAL | 1 refills | Status: DC

## 2020-04-08 MED FILL — METHOCARBAMOL 500 MG TABS: 500 | 15 days supply | Qty: 90 | Fill #0

## 2020-04-08 MED FILL — NAPROXEN 500 MG TABLET: 500 | 30 days supply | Qty: 60 | Fill #0
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
## 2020-04-09 ENCOUNTER — Other Ambulatory Visit: Payer: Self-pay | Admitting: Physician Assistant

## 2020-04-09 LAB — VITAMIN D 25 HYDROXY (VIT D DEFICIENCY, FRACTURES): Vit D, 25-Hydroxy: 19.6 ng/mL — ABNORMAL LOW (ref 30.0–100.0)

## 2020-04-09 LAB — B12 AND FOLATE PANEL
Folate: 9.2 ng/mL (ref >3.0)
Vitamin B-12: 742 pg/mL (ref 232–1245)

## 2020-04-09 LAB — TSH: TSH: 0.451 u[IU]/mL (ref 0.450–4.500)

## 2020-04-09 MED ORDER — VITAMIN D (ERGOCALCIFEROL) 1.25 MG (50000 UNIT) PO CAPS: 50000.0000 [IU] | ORAL_CAPSULE | ORAL | 0 refills | Status: AC

## 2020-04-15 ENCOUNTER — Telehealth (INDEPENDENT_AMBULATORY_CARE_PROVIDER_SITE_OTHER): Payer: Self-pay

## 2020-04-15 NOTE — Telephone Encounter (Signed)
-----   Message from Anders Simmonds, [place] sent at 04/09/2020  1:10 PM EST ----- Please call patient and let them that their vitamin D is low.  This can contribute to muscle aches, anxiety, fatigue, and depression.  I have sent a prescription to the pharmacy for them to take once a week.  We will recheck this level in 3-4 months.  Her thyroid, vitamin B12 and folate are all normal.  Thanks, Georgian Co, PA-C

## 2020-04-15 NOTE — Telephone Encounter (Signed)
Patient returned call. She is aware that her vitamin D is low. Which can contribute to muscle aches, anxiety, fatigue and depression. She is aware that prescription has been sent to pharmacy for her to take once a week. Will recheck vitamin d in 3-4 months. All other labs normal. She verbalized understanding. Maryjean Morn, CMA

## 2020-05-14 ENCOUNTER — Ambulatory Visit: Payer: Medicaid Other | Admitting: Physician Assistant

## 2020-05-15 ENCOUNTER — Ambulatory Visit: Payer: Self-pay | Admitting: *Deleted

## 2020-05-15 NOTE — Telephone Encounter (Signed)
Pt called with complaints of breast leakage since Dec; she missed her appt on 05/14/20; the next avaialbel appt is 06/17/20; she has a white discharge from her nipple of her right breast; she has intermittent pain; thept says she was told by a breast specialist this needed to be handled by her PCP first; recommendations made per nurse triage protocol; she verbalized understanding and says she can not wait that long; she was to see Dr Sharon Seller at [facility]; the next available appt is 06/10/20 and the pt declined being scheduled; the pt would like to be added to the wait list to be seen sooner; she can be contacted at [phone] and a message can be left on her voicemail; will route to office for notification of encounter; decision tree has been completed; will route to office for final disposition.  Reason for Disposition . [1] Breast tenderness and fullness AND [2] pregnant  Answer Assessment - Initial Assessment Questions 1. SYMPTOM: "What's the main symptom you're concerned about?"  (e.g., lump, pain, rash, nipple discharge)     *No Answer* 2. LOCATION: "Where is the *No Answer* located?"     *No Answer* 3. ONSET: "When did *No Answer*  start?"     *No Answer* 4. PRIOR HISTORY: "Do you have any history of prior problems with your breasts?" (e.g., lumps, cancer, fibrocystic breast disease)     *No Answer* 5. CAUSE: "What do you think is causing this symptom?"     *No Answer* 6. OTHER SYMPTOMS: "Do you have any other symptoms?" (e.g., fever, breast pain, redness or rash, nipple discharge)     *No Answer* 7. PREGNANCY-BREASTFEEDING: "Is there any chance you are pregnant?" "When was your last menstrual period?" "Are you breastfeeding?"    No lesbian  Protocols used: BREAST [facility]

## 2020-05-15 NOTE — Telephone Encounter (Signed)
Returned call to patient and scheduled an appt with Cari at [facility] on Monday 05/18/20.

## 2020-05-18 ENCOUNTER — Ambulatory Visit (INDEPENDENT_AMBULATORY_CARE_PROVIDER_SITE_OTHER): Payer: Medicaid Other | Admitting: Physician Assistant

## 2020-05-18 ENCOUNTER — Other Ambulatory Visit: Payer: Self-pay

## 2020-05-18 ENCOUNTER — Encounter: Payer: Self-pay | Admitting: Physician Assistant

## 2020-05-18 VITALS — BP 124/85 | HR 84 | Temp 97.3°F | Resp 18 | Ht 64.0 in | Wt 207.0 lb

## 2020-05-18 DIAGNOSIS — Z789 Other specified health status: Secondary | ICD-10-CM

## 2020-05-18 DIAGNOSIS — N6452 Nipple discharge: Secondary | ICD-10-CM | POA: Diagnosis not present

## 2020-05-18 DIAGNOSIS — N644 Mastodynia: Secondary | ICD-10-CM

## 2020-05-18 DIAGNOSIS — N6314 Unspecified lump in the right breast, lower inner quadrant: Secondary | ICD-10-CM

## 2020-05-18 NOTE — Progress Notes (Signed)
Established Patient Office Visit  Subjective:  Patient ID: Annette Hurley, female    DOB: 1990/09/15  Age: 30 y.o. MRN: [id number]  CC:  Chief Complaint  Patient presents with  . Breast Discharge    Right    HPI [name] reports that she has been having a puslike discharge from her right nipple.  Reports that she will also have pain in her right breast.  Reports pain is on and off, states it feels throbbing, like a sharp pain.  Reports discharge occurs approximately every other day.  Reports that she has had this in the past, had imaging completed when she lived in [place], but unfortunately did not follow-up.  Reports that she does not take psychiatric medications, states that she has never taken Risperdal.     Past Medical History:  Diagnosis Date  . Asthma   . Hypertension     History reviewed. No pertinent surgical history.  History reviewed. No pertinent family history.  Social History   Socioeconomic History  . Marital status: Married    Spouse name: Not on file  . Number of children: Not on file  . Years of education: Not on file  . Highest education level: Not on file  Occupational History  . Not on file  Tobacco Use  . Smoking status: Current Every Day Smoker    Packs/day: 1.00    Types: Cigarettes  . Smokeless tobacco: Current User  Vaping Use  . Vaping Use: Not on file  Substance and Sexual Activity  . Alcohol use: Not Currently  . Drug use: Yes    Types: Marijuana  . Sexual activity: Yes    Birth control/protection: None  Other Topics Concern  . Not on file  Social History Narrative  . Not on file   Social Determinants of Health   Financial Resource Strain: Not on file  Food Insecurity: Not on file  Transportation Needs: Not on file  Physical Activity: Not on file  Stress: Not on file  Social Connections: Not on file  Intimate Partner Violence: Not on file    Outpatient Medications Prior to Visit  Medication Sig  Dispense Refill  . amLODipine (NORVASC) 10 MG tablet Take 1 tablet (10 mg total) by mouth daily. (Patient not taking: No sig reported) 90 tablet 1  . amoxicillin-clavulanate (AUGMENTIN) 875-125 MG tablet Take 1 tablet by mouth every 12 (twelve) hours. (Patient not taking: Reported on 04/08/2020) 14 tablet 0  . ascorbic acid (VITAMIN C) 500 MG tablet Take 500 mg by mouth daily. (Patient not taking: Reported on 04/08/2020)    . methocarbamol (ROBAXIN) 500 MG tablet Take 2 tablets (1,000 mg total) by mouth 3 (three) times daily. X 10 days then prn muscle spasm 90 tablet 1  . PANTOPRAZOLE SODIUM PO Take 40 mg by mouth daily. Dose and frequency unknown  (Patient not taking: Reported on 04/08/2020)    . QUEtiapine (SEROQUEL) 100 MG tablet Take 1 tablet (100 mg total) by mouth at bedtime. (Patient not taking: Reported on 04/08/2020) 90 tablet 1  . Vitamin D, Ergocalciferol, (DRISDOL) 1.25 MG (50000 UNIT) CAPS capsule Take 1 capsule (50,000 Units total) by mouth every 7 (seven) days. 16 capsule 0  . benztropine (COGENTIN) 0.5 MG tablet Take 1 tablet (0.5 mg total) by mouth 2 (two) times daily. (Patient not taking: No sig reported) 60 tablet 2  . naproxen (NAPROSYN) 500 MG tablet Take 1 tablet (500 mg total) by mouth 2 (two) times daily  with a meal. X 10 days then prn pain 60 tablet 1  . risperiDONE (RISPERDAL) 3 MG tablet Take 1 tablet (3 mg total) by mouth 2 (two) times daily. (Patient not taking: No sig reported) 60 tablet 2   No facility-administered medications prior to visit.    No Known Allergies  ROS Review of Systems  Constitutional: Negative for chills and fever.  HENT: Negative.   Eyes: Negative.   Respiratory: Negative.   Cardiovascular: Negative.   Gastrointestinal: Negative.   Endocrine: Negative.   Genitourinary: Negative.   Musculoskeletal: Negative.   Skin: Negative for color change and wound.  Allergic/Immunologic: Negative.   Neurological: Negative.   Hematological: Negative.        Objective:    Physical Exam Vitals and nursing note reviewed. Exam conducted with a chaperone present.  Constitutional:      Appearance: Normal appearance.  HENT:     Head: Normocephalic and atraumatic.     Left Ear: External ear normal.     Nose: Nose normal.     Mouth/Throat:     Mouth: Mucous membranes are moist.     Pharynx: Oropharynx is clear.  Eyes:     Extraocular Movements: Extraocular movements intact.     Conjunctiva/sclera: Conjunctivae normal.     Pupils: Pupils are equal, round, and reactive to light.  Cardiovascular:     Rate and Rhythm: Normal rate and regular rhythm.     Pulses: Normal pulses.     Heart sounds: Normal heart sounds.  Pulmonary:     Effort: Pulmonary effort is normal.     Breath sounds: Normal breath sounds.  Chest:  Breasts:     Right: Mass and tenderness present. No swelling or nipple discharge.     Left: Normal.        Comments: Nipple piercings present bilaterally "bar type" no purulence noted, no erythema noted Musculoskeletal:        General: Normal range of motion.     Cervical back: Normal range of motion and neck supple.  Skin:    General: Skin is warm and dry.  Neurological:     General: No focal deficit present.     Mental Status: She is alert and oriented to person, place, and time.  Psychiatric:        Mood and Affect: Mood normal.        Behavior: Behavior normal.        Thought Content: Thought content normal.        Judgment: Judgment normal.     BP 124/85 (BP Location: Left Arm, Patient Position: Sitting, Cuff Size: Normal)   Pulse 84   Temp (!) 97.3 F (36.3 C) (Oral)   Resp 18   Ht 5\' 4"  (1.626 m)   Wt 207 lb (93.9 kg)   LMP 05/12/2020   SpO2 100%   BMI 35.53 kg/m  Wt Readings from Last 3 Encounters:  05/18/20 207 lb (93.9 kg)  04/08/20 207 lb 9.6 oz (94.2 kg)  12/26/18 200 lb (90.7 kg)     Health Maintenance Due  Topic Date Due  . Hepatitis C Screening  Never done  . COVID-19 Vaccine (1)  Never done  . HIV Screening  Never done  . TETANUS/TDAP  Never done  . PAP-Cervical Cytology Screening  Never done  . PAP SMEAR-Modifier  Never done  . INFLUENZA VACCINE  Never done    There are no preventive care reminders to display for this patient.  Lab Results  Component Value Date   TSH 0.451 04/08/2020   Lab Results  Component Value Date   HGB 15.3 (H) 03/12/2020   HCT 45.0 03/12/2020   Lab Results  Component Value Date   NA 138 03/12/2020   K 4.0 03/12/2020   GLUCOSE 97 03/12/2020   BUN 11 03/12/2020   CREATININE 0.80 03/12/2020   No results found for: CHOL No results found for: HDL No results found for: LDLCALC No results found for: TRIG No results found for: CHOLHDL No results found for: YWVP7T    Assessment & Plan:   Problem List Items Addressed This Visit   None   Visit Diagnoses    Breast pain, right    -  Primary   Relevant Orders   US BREAST COMPLETE UNI RIGHT INC AXILLA   Lump in lower inner quadrant of right breast       Relevant Orders   US BREAST COMPLETE UNI RIGHT INC AXILLA   Discharge from right nipple       Relevant Orders   US BREAST COMPLETE UNI RIGHT INC AXILLA   Body piercing       Relevant Orders   US BREAST COMPLETE UNI RIGHT INC AXILLA     1. Breast pain, right Patient education given, patient does not want to remove nipple ring at this time.  Encourage keeping area of piercing clean and dry.  - US BREAST COMPLETE UNI RIGHT INC AXILLA; Future  2. Lump in lower inner quadrant of right breast  - US BREAST COMPLETE UNI RIGHT INC AXILLA; Future  3. Discharge from right nipple No discharge present at this time, follow-up with ultrasound - US BREAST COMPLETE UNI RIGHT INC AXILLA; Future  4. Body piercing  - US BREAST COMPLETE UNI RIGHT INC AXILLA; Future   I have reviewed the patient's medical history (PMH, PSH, Social History, Family History, Medications, and allergies) , and have been updated if relevant. I spent 30  minutes reviewing chart and  face to face time with patient.  Patient given appointment to establish primary care.   No orders of the defined types were placed in this encounter.   Follow-up: No follow-ups on file.    Kasandra Knudsen Amari Zagal, PA-C

## 2020-05-18 NOTE — Patient Instructions (Signed)
We are going to schedule an ultrasound of your breast to be completed.  We will call you with those results.  Roney Jaffe, PA-C Physician Assistant [facility] [email or web address]    Breast Tenderness Breast tenderness is a common problem for women of all ages, but may also occur in men. Breast tenderness may range from mild discomfort to severe pain. In women, the pain usually comes and goes with the menstrual cycle, but it can also be constant. Breast tenderness has many possible causes, including hormone changes, infections, and taking certain medicines. You may have tests, such as a mammogram or an ultrasound, to check for any unusual findings. Having breast tenderness usually does not mean that you have breast cancer. Follow these instructions at home: Managing pain and discomfort  If directed, put ice to the painful area. To do this: ? Put ice in a plastic bag. ? Place a towel between your skin and the bag. ? Leave the ice on for 20 minutes, 2-3 times a day.  Wear a supportive bra, especially during exercise. You may also want to wear a supportive bra while sleeping if your breasts are very tender.   Medicines  Take over-the-counter and prescription medicines only as told by your health care provider. If the cause of your pain is infection, you may be prescribed an antibiotic medicine.  If you were prescribed an antibiotic, take it as told by your health care provider. Do not stop taking the antibiotic even if you start to feel better. Eating and drinking  Your health care provider may recommend that you lessen the amount of fat in your diet. You can do this by: ? Limiting fried foods. ? Cooking foods using methods such as baking, boiling, grilling, and broiling.  Decrease the amount of caffeine in your diet. Instead, drink more water and choose caffeine-free drinks. General instructions  Keep a log of the days and  times when your breasts are most tender.  Ask your health care provider how to do breast exams at home. This will help you notice if you have an unusual growth or lump.  Keep all follow-up visits as told by your health care provider. This is important.   Contact a health care provider if:  Any part of your breast is hard, red, and hot to the touch. This may be a sign of infection.  You are a woman and: ? Not breastfeeding and you have fluid, especially blood or pus, coming out of your nipples. ? Have a new or painful lump in your breast that remains after your menstrual period ends.  You have a fever.  Your pain does not improve or it gets worse.  Your pain is interfering with your daily activities. Summary  Breast tenderness may range from mild discomfort to severe pain.  Breast tenderness has many possible causes, including hormone changes, infections, and taking certain medicines.  It can be treated with ice, wearing a supportive bra, and medicines.  Make changes to your diet if told to by your health care provider. This information is not intended to replace advice given to you by your health care provider. Make sure you discuss any questions you have with your health care provider. Document Revised: 08/20/2018 Document Reviewed: 08/20/2018 Elsevier Patient Education  2021 [employer].

## 2020-05-20 ENCOUNTER — Encounter: Payer: Self-pay | Admitting: Physician Assistant

## 2020-05-20 DIAGNOSIS — N6452 Nipple discharge: Secondary | ICD-10-CM | POA: Insufficient documentation

## 2020-05-20 DIAGNOSIS — Z789 Other specified health status: Secondary | ICD-10-CM | POA: Insufficient documentation

## 2020-05-20 DIAGNOSIS — N6314 Unspecified lump in the right breast, lower inner quadrant: Secondary | ICD-10-CM | POA: Insufficient documentation

## 2020-05-20 DIAGNOSIS — N644 Mastodynia: Secondary | ICD-10-CM | POA: Insufficient documentation

## 2020-06-17 ENCOUNTER — Other Ambulatory Visit: Payer: Self-pay

## 2020-06-17 ENCOUNTER — Telehealth: Payer: Medicaid Other | Admitting: Internal Medicine

## 2020-06-25 ENCOUNTER — Ambulatory Visit (INDEPENDENT_AMBULATORY_CARE_PROVIDER_SITE_OTHER): Payer: Medicaid Other | Admitting: Primary Care

## 2020-06-25 ENCOUNTER — Encounter (INDEPENDENT_AMBULATORY_CARE_PROVIDER_SITE_OTHER): Payer: Self-pay | Admitting: Primary Care

## 2020-06-25 ENCOUNTER — Other Ambulatory Visit: Payer: Self-pay

## 2020-06-25 VITALS — BP 123/86 | HR 107 | Temp 97.3°F | Ht 64.0 in | Wt 213.0 lb

## 2020-06-25 DIAGNOSIS — Z419 Encounter for procedure for purposes other than remedying health state, unspecified: Secondary | ICD-10-CM | POA: Diagnosis not present

## 2020-06-25 DIAGNOSIS — Z131 Encounter for screening for diabetes mellitus: Secondary | ICD-10-CM

## 2020-06-25 DIAGNOSIS — J4520 Mild intermittent asthma, uncomplicated: Secondary | ICD-10-CM

## 2020-06-25 DIAGNOSIS — N6320 Unspecified lump in the left breast, unspecified quadrant: Secondary | ICD-10-CM

## 2020-06-25 DIAGNOSIS — R300 Dysuria: Secondary | ICD-10-CM | POA: Diagnosis not present

## 2020-06-25 LAB — POCT URINALYSIS DIP (CLINITEK)
Bilirubin, UA: NEGATIVE
Blood, UA: NEGATIVE
Glucose, UA: NEGATIVE mg/dL
Ketones, POC UA: NEGATIVE mg/dL
Leukocytes, UA: NEGATIVE
Nitrite, UA: NEGATIVE
Spec Grav, UA: >=1.03 — AB (ref 1.010–1.025)
Urobilinogen, UA: 0.2 E.U./dL
pH, UA: 6.5 (ref 5.0–8.0)

## 2020-06-25 LAB — POCT GLYCOSYLATED HEMOGLOBIN (HGB A1C): Hemoglobin A1C: 4.9 % (ref 4.0–5.6)

## 2020-06-25 NOTE — Progress Notes (Signed)
Established Patient Office Visit  Subjective:  Patient ID: Annette Hurley, female    DOB: 19-Jun-1990  Age: 30 y.o. MRN: [id number]  CC:  Chief Complaint  Patient presents with  . Establish Care    HPI Ms.Annette Hurley is a 30 year old morbid obese female who presents for medical clearance for elective surgery   Past Medical History:  Diagnosis Date  . Asthma   . Hypertension     No past surgical history on file.  No family history on file.  Social History   Socioeconomic History  . Marital status: Married    Spouse name: Not on file  . Number of children: Not on file  . Years of education: Not on file  . Highest education level: Not on file  Occupational History  . Not on file  Tobacco Use  . Smoking status: Current Every Day Smoker    Packs/day: 1.00    Types: Cigarettes  . Smokeless tobacco: Current User  Vaping Use  . Vaping Use: Not on file  Substance and Sexual Activity  . Alcohol use: Not Currently  . Drug use: Yes    Types: Marijuana  . Sexual activity: Yes    Birth control/protection: None  Other Topics Concern  . Not on file  Social History Narrative  . Not on file   Social Determinants of Health   Financial Resource Strain: Not on file  Food Insecurity: Not on file  Transportation Needs: Not on file  Physical Activity: Not on file  Stress: Not on file  Social Connections: Not on file  Intimate Partner Violence: Not on file    Outpatient Medications Prior to Visit  Medication Sig Dispense Refill  . Vitamin D, Ergocalciferol, (DRISDOL) 1.25 MG (50000 UNIT) CAPS capsule Take 1 capsule (50,000 Units total) by mouth every 7 (seven) days. 16 capsule 0  . amLODipine (NORVASC) 10 MG tablet Take 1 tablet (10 mg total) by mouth daily. (Patient not taking: No sig reported) 90 tablet 1  . amoxicillin-clavulanate (AUGMENTIN) 875-125 MG tablet Take 1 tablet by mouth every 12 (twelve) hours. (Patient not taking: Reported on 04/08/2020) 14  tablet 0  . ascorbic acid (VITAMIN C) 500 MG tablet Take 500 mg by mouth daily. (Patient not taking: Reported on 04/08/2020)    . methocarbamol (ROBAXIN) 500 MG tablet Take 2 tablets (1,000 mg total) by mouth 3 (three) times daily. X 10 days then prn muscle spasm 90 tablet 1  . PANTOPRAZOLE SODIUM PO Take 40 mg by mouth daily. Dose and frequency unknown  (Patient not taking: Reported on 04/08/2020)    . QUEtiapine (SEROQUEL) 100 MG tablet Take 1 tablet (100 mg total) by mouth at bedtime. (Patient not taking: Reported on 04/08/2020) 90 tablet 1   No facility-administered medications prior to visit.    Allergies  Allergen Reactions  . Other Itching and Swelling    ROS Review of Systems  Skin:       Bilateral breast tenderness  All other systems reviewed and are negative.     Objective:    Physical Exam Vitals reviewed.  Constitutional:      Appearance: She is obese.  HENT:     Head: Normocephalic.     Right Ear: Tympanic membrane and external ear normal.     Left Ear: Tympanic membrane and external ear normal.     Nose: Nose normal.  Eyes:     Extraocular Movements: Extraocular movements intact.     Pupils: Pupils  are equal, round, and reactive to light.  Cardiovascular:     Rate and Rhythm: Normal rate.  Pulmonary:     Effort: Pulmonary effort is normal.  Chest:    Abdominal:     General: Bowel sounds are normal. There is distension.     Palpations: Abdomen is soft.  Musculoskeletal:        General: Normal range of motion.     Cervical back: Normal range of motion and neck supple.  Skin:    General: Skin is warm and dry.  Neurological:     Mental Status: She is alert and oriented to person, place, and time.  Psychiatric:        Mood and Affect: Mood normal.        Behavior: Behavior normal.        Thought Content: Thought content normal.        Judgment: Judgment normal.     BP 123/86 (BP Location: Right Arm, Patient Position: Sitting, Cuff Size: Large)    Pulse (!) 107   Temp (!) 97.3 F (36.3 C) (Temporal)   Ht 5\' 4"  (1.626 m)   Wt 213 lb (96.6 kg)   SpO2 93%   BMI 36.56 kg/m  Wt Readings from Last 3 Encounters:  06/25/20 213 lb (96.6 kg)  05/18/20 207 lb (93.9 kg)  04/08/20 207 lb 9.6 oz (94.2 kg)     Health Maintenance Due  Topic Date Due  . Hepatitis C Screening  Never done  . COVID-19 Vaccine (1) Never done  . HIV Screening  Never done  . PAP-Cervical Cytology Screening  Never done  . PAP SMEAR-Modifier  Never done    There are no preventive care reminders to display for this patient.  Lab Results  Component Value Date   TSH 0.451 04/08/2020   Lab Results  Component Value Date   HGB 15.3 (H) 03/12/2020   HCT 45.0 03/12/2020   Lab Results  Component Value Date   NA 138 03/12/2020   K 4.0 03/12/2020   GLUCOSE 97 03/12/2020   BUN 11 03/12/2020   CREATININE 0.80 03/12/2020   No results found for: CHOL No results found for: HDL No results found for: LDLCALC No results found for: TRIG No results found for: CHOLHDL No results found for: 14/05/2019    Assessment & Plan:  Newell was seen today for establish care.  Diagnoses and all orders for this visit:  Screening for diabetes mellitus -     HgB A1c 4.9 Per ADA guidelines she is not a diabetic   Surgery, elective -     EKG 12-Lead -     CBC with Differential -     Protime-INR -     Basic Metabolic Panel -     hCG, quantitative, pregnancy -     DG Chest 2 View; Future -     APTT  Dysuria -     POCT URINALYSIS DIP (CLINITEK)  Mild intermittent asthma, unspecified whether complicated Chest x-ray /elective surgery   Lump of left breast Tenderness nodule felt 5-7 will schedule diagnostic mammogram . Patient was upset and questioned why the breast exam was not done order not place from previous visit.  Follow-up: Return if symptoms worsen or fail to improve.    Margaretmary Lombard, NP

## 2020-06-25 NOTE — Progress Notes (Signed)
CBC W PLATELETS Pt/INR APPT  BMP HIV HCG URINALYSIS  CHEST XRAY -ASTHMA

## 2020-06-26 ENCOUNTER — Telehealth: Payer: Self-pay | Admitting: Primary Care

## 2020-06-26 LAB — CBC WITH DIFFERENTIAL/PLATELET
Basophils Absolute: 0 10*3/uL (ref 0.0–0.2)
Basos: 1 %
EOS (ABSOLUTE): 0.2 10*3/uL (ref 0.0–0.4)
Eos: 4 %
Hematocrit: 44.1 % (ref 34.0–46.6)
Hemoglobin: 14.3 g/dL (ref 11.1–15.9)
Immature Grans (Abs): 0 10*3/uL (ref 0.0–0.1)
Immature Granulocytes: 0 %
Lymphocytes Absolute: 2 10*3/uL (ref 0.7–3.1)
Lymphs: 36 %
MCH: 28 pg (ref 26.6–33.0)
MCHC: 32.4 g/dL (ref 31.5–35.7)
MCV: 86 fL (ref 79–97)
Monocytes Absolute: 0.5 10*3/uL (ref 0.1–0.9)
Monocytes: 10 %
Neutrophils Absolute: 2.7 10*3/uL (ref 1.4–7.0)
Neutrophils: 49 %
Platelets: 283 10*3/uL (ref 150–450)
RBC: 5.11 x10E6/uL (ref 3.77–5.28)
RDW: 15.1 % (ref 11.7–15.4)
WBC: 5.6 10*3/uL (ref 3.4–10.8)

## 2020-06-26 LAB — BASIC METABOLIC PANEL
BUN/Creatinine Ratio: 9 (ref 9–23)
BUN: 9 mg/dL (ref 6–20)
CO2: 19 mmol/L — ABNORMAL LOW (ref 20–29)
Calcium: 9.2 mg/dL (ref 8.7–10.2)
Chloride: 103 mmol/L (ref 96–106)
Creatinine, Ser: 0.98 mg/dL (ref 0.57–1.00)
Glucose: 95 mg/dL (ref 65–99)
Potassium: 4.2 mmol/L (ref 3.5–5.2)
Sodium: 137 mmol/L (ref 134–144)
eGFR: 80 mL/min/{1.73_m2} (ref >59)

## 2020-06-26 LAB — PROTIME-INR
INR: 1 (ref 0.9–1.2)
Prothrombin Time: 10.5 s (ref 9.1–12.0)

## 2020-06-26 LAB — APTT: aPTT: 31 s (ref 24–33)

## 2020-06-26 LAB — BETA HCG QUANT (REF LAB): hCG Quant: <1 m[IU]/mL

## 2020-06-26 NOTE — Telephone Encounter (Signed)
Pt returned call f/u for Gwinda Passe NP . Please return call to pt at [phone].

## 2020-07-06 ENCOUNTER — Telehealth (INDEPENDENT_AMBULATORY_CARE_PROVIDER_SITE_OTHER): Payer: Self-pay | Admitting: Primary Care

## 2020-07-06 NOTE — Telephone Encounter (Signed)
Patient called because she had requested that her lab results be faxed to her surgeon in [place].  She was told that they have not received the results.  Please advise and call patient to discuss at [phone]

## 2020-07-06 NOTE — Telephone Encounter (Signed)
Patient presented to office today about clearance forms not being faxed. Showed patient transmit log where they had been faxed on 3/24. Refaxed forms and confirmed that they were received while patient was in office.

## 2020-07-07 ENCOUNTER — Ambulatory Visit (INDEPENDENT_AMBULATORY_CARE_PROVIDER_SITE_OTHER): Payer: Medicaid Other | Admitting: Primary Care

## 2020-07-13 ENCOUNTER — Ambulatory Visit: Payer: Medicaid Other | Admitting: Family

## 2020-07-15 ENCOUNTER — Ambulatory Visit (INDEPENDENT_AMBULATORY_CARE_PROVIDER_SITE_OTHER): Payer: Medicaid Other | Admitting: Primary Care

## 2020-10-05 ENCOUNTER — Ambulatory Visit (INDEPENDENT_AMBULATORY_CARE_PROVIDER_SITE_OTHER): Payer: Medicaid Other | Admitting: Primary Care

## 2020-12-20 ENCOUNTER — Other Ambulatory Visit: Payer: Self-pay

## 2020-12-20 ENCOUNTER — Emergency Department (HOSPITAL_COMMUNITY)
Admission: EM | Admit: 2020-12-20 | Discharge: 2020-12-20 | Disposition: A | Discharge status: Home / Self Care | Payer: Medicaid Other

## 2020-12-20 NOTE — ED Triage Notes (Signed)
Pt arrives via ems. She was found sitting on a guardrail by ems. Pt refused to speak to ems staff. During triage, pt got out of her wheelchair, walked outside and proceeded to leave the premises. This RN and PA attempted to see if the patient would come back in for evaluation, she did not speak and continued to leave.

## 2020-12-20 NOTE — ED Provider Notes (Cosign Needed)
Brought in by EMS[place] for 2 hours on a guardrail. Patient not saying anything. Patient got up and ran out of the ED. RN and myself followed her and she began to walk away in the parking lot. I personally did not have enough information to IVC her at this time because I was unable to get a history.    Mannie Stabile, [place] 12/20/20 1331
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
## 2021-07-16 ENCOUNTER — Emergency Department (HOSPITAL_COMMUNITY)
Admission: EM | Admit: 2021-07-16 | Discharge: 2021-07-16 | Disposition: A | Discharge status: Home / Self Care | Payer: Medicaid Other | Attending: Emergency Medicine | Admitting: Emergency Medicine

## 2021-07-16 ENCOUNTER — Encounter (HOSPITAL_COMMUNITY): Payer: Self-pay | Admitting: *Deleted

## 2021-07-16 DIAGNOSIS — N898 Other specified noninflammatory disorders of vagina: Secondary | ICD-10-CM | POA: Insufficient documentation

## 2021-07-16 DIAGNOSIS — Z202 Contact with and (suspected) exposure to infections with a predominantly sexual mode of transmission: Secondary | ICD-10-CM

## 2021-07-16 LAB — URINALYSIS, ROUTINE W REFLEX MICROSCOPIC
Bacteria, UA: NONE SEEN
Bilirubin Urine: NEGATIVE
Glucose, UA: NEGATIVE mg/dL
Hgb urine dipstick: NEGATIVE
Ketones, ur: NEGATIVE mg/dL
Leukocytes,Ua: NEGATIVE
Nitrite: NEGATIVE
Protein, ur: 30 mg/dL — AB
Specific Gravity, Urine: 1.025 (ref 1.005–1.030)
pH: 7 (ref 5.0–8.0)

## 2021-07-16 LAB — WET PREP, GENITAL
Clue Cells Wet Prep HPF POC: NONE SEEN
Sperm: NONE SEEN
Trich, Wet Prep: NONE SEEN
WBC, Wet Prep HPF POC: <10 (ref <10)
Yeast Wet Prep HPF POC: NONE SEEN

## 2021-07-16 LAB — PREGNANCY, URINE: Preg Test, Ur: NEGATIVE

## 2021-07-16 NOTE — ED Notes (Signed)
Pelvic set up in the room  

## 2021-07-16 NOTE — ED Notes (Signed)
Patient refuses a gown. RN notified. ?

## 2021-07-16 NOTE — ED Notes (Signed)
ED Provider at bedside. 

## 2021-07-16 NOTE — ED Notes (Addendum)
Pt refused to have her temperature taken before discharge.  D/c instructions were reviewed by EDP and pt was provided with her d/c papers. Pt refused to sign for the d/c instructions as well. Pt with no complaints of pain.  ?

## 2021-07-16 NOTE — Discharge Instructions (Addendum)
It was a pleasure taking care of you today!  ? ?You were tested today for gonorrhea, chlamydia your results will appear on MyChart within 48 to 72 hours.  If there are any positive results, you will be notified via telephone to receive treatment.  Your urinalysis was unremarkable today.  Attached is information for [facility], call to set up a follow-up appointment regarding today's ED visit.  You may follow-up with your primary care provider as needed.  Return to the emergency department for experiencing increasing/worsening vaginal discharge, abdominal pain, vomiting, worsening symptoms. ?

## 2021-07-16 NOTE — ED Triage Notes (Signed)
States she feels bad, denies vaginal discharge and requests to be checked. Patient insists her birth date is not correct and states she is 31 years old now.  ?

## 2021-07-16 NOTE — ED Provider Notes (Signed)
?[facility] ?Provider Note ? ? ?CSN: [id number] ?Arrival date & time: 07/16/21  1249 ? ?  ? ?History ? ?No chief complaint on file. ? ? ?Annette Hurley is a 31 y.o. female who presents to the emergency department complaining of vaginal discharge.  Patient notes that she would like to be checked out.  She denies being sexually active.  Denies concerns for pregnancy at this time.  Denies abdominal pain, nausea, vomiting, fever, chills.  ? ?The history is provided by the patient. No language interpreter was used.  ? ?  ? ?Home Medications ?Prior to Admission medications   ?Medication Sig Start Date End Date Taking? Authorizing Provider  ?Vitamin D, Ergocalciferol, (DRISDOL) 1.25 MG (50000 UNIT) CAPS capsule Take 1 capsule (50,000 Units total) by mouth every 7 (seven) days. 04/09/20   Argentina Donovan, PA-C  ?   ? ?Allergies    ?Other   ? ?Review of Systems   ?Review of Systems  ?Constitutional:  Negative for chills and fever.  ?Gastrointestinal:  Negative for abdominal pain, nausea and vomiting.  ?Genitourinary:  Positive for vaginal discharge. Negative for vaginal bleeding.  ?All other systems reviewed and are negative. ? ?Physical Exam ?Updated Vital Signs ?BP 107/75 (BP Location: Right Arm)   Pulse 95   Temp 98.5 ?F (36.9 ?C) (Oral)   Resp 16   SpO2 100%  ?Physical Exam ?Vitals and nursing note reviewed. Exam conducted with a chaperone present.  ?Constitutional:   ?   General: She is not in acute distress. ?   Appearance: Normal appearance. She is not ill-appearing, toxic-appearing or diaphoretic.  ?HENT:  ?   Head: Normocephalic and atraumatic.  ?   Right Ear: External ear normal.  ?   Left Ear: External ear normal.  ?Eyes:  ?   General: No scleral icterus. ?   Extraocular Movements: Extraocular movements intact.  ?Cardiovascular:  ?   Rate and Rhythm: Normal rate and regular rhythm.  ?   Pulses: Normal pulses.  ?   Heart sounds: Normal heart sounds.  ?Pulmonary:  ?   Effort: Pulmonary  effort is normal. No respiratory distress.  ?   Breath sounds: Normal breath sounds.  ?Abdominal:  ?   General: Abdomen is flat. Bowel sounds are normal. There is no distension.  ?   Palpations: Abdomen is soft. There is no mass.  ?   Tenderness: There is no abdominal tenderness.  ?   Hernia: There is no hernia in the left inguinal area or right inguinal area.  ?Genitourinary: ?   Pubic Area: No rash.   ?   Labia:     ?   Right: No rash, tenderness, lesion or injury.     ?   Left: No rash, tenderness, lesion or injury.   ?   Vagina: No signs of injury and foreign body. Vaginal discharge (white) present. No erythema, tenderness or bleeding.  ?   Cervix: Normal.  ?   Uterus: Normal. Not deviated, not enlarged, not fixed and not tender.   ?   Adnexa: Right adnexa normal and left adnexa normal.  ?   Comments: RN chaperone present for exam. ?Musculoskeletal:     ?   General: Normal range of motion.  ?   Cervical back: Normal range of motion and neck supple.  ?Lymphadenopathy:  ?   Lower Body: No right inguinal adenopathy. No left inguinal adenopathy.  ?Skin: ?   General: Skin is warm and dry.  ?Neurological:  ?  Mental Status: She is alert.  ? ? ?ED Results / Procedures / Treatments   ?Labs ?(all labs ordered are listed, but only abnormal results are displayed) ?Labs Reviewed  ?URINALYSIS, ROUTINE W REFLEX MICROSCOPIC - Abnormal; Notable for the following components:  ?    Result Value  ? APPearance HAZY (*)   ? Protein, ur 30 (*)   ? All other components within normal limits  ?WET PREP, GENITAL  ?PREGNANCY, URINE  ?GC/CHLAMYDIA PROBE AMP ([facility]) NOT AT [facility]  ? ? ?EKG ?None ? ?Radiology ?No results found. ? ?Procedures ?Procedures  ? ? ?Medications Ordered in ED ?Medications - No data to display ? ?ED Course/ Medical Decision Making/ A&P ?Clinical Course as of 07/16/21 1544  ?Fri Jul 16, 2021  ?1355 Patient denies wanting HIV or syphilis at this time. [SB]  ?1413 Patient requesting pelvic exam to be completed  without bedpan.  Had a detailed discussion with patient prior to and notified her that this may cause a pelvic exam to be a little bit longer and uncomfortable for her.  Patient adamant about proceeding without the bedpan.  Following pelvic exam completion, patient notes that her head hurts and she wants the voices to stop.  Patient used to be on Haldol and clonazepam for her symptoms however she is no longer on those medications.  Patient denies SI/HI, visual hallucinations, command auditory hallucinations, at this time.  [SB]  ?1513 Discussed with patient discharge treatment plan.  Patient agreeable at this time.  Patient appears safe for discharge. [SB]  ?  ?Clinical Course User Index ?[SB] Shenicka Sunderlin A, PA-C  ? ?                        ?Medical Decision Making ?Amount and/or Complexity of Data Reviewed ?Labs: ordered. ? ? ?Patient presents to the ED with vaginal discharge.  Patient notes that she is not sexually active.  She notes that she would like to be checked today.  Vital signs stable, patient afebrile, not tachycardic or hypoxic.  On exam patient without acute cardiovascular, respiratory, abdominal exam findings.  RN chaperone present for GU exam which was notable for white vaginal discharge.  Unable to visualize cervix did not want to utilize the bedpan to aid with ease of speculum exam.  See ED course for further discussion.  Differential diagnosis includes acute cystitis, gonorrhea, chlamydia, bacterial vaginosis, trichomonas. ? ? ?Labs:  ?I ordered, and personally interpreted labs.  The pertinent results include:   ?Urinalysis unremarkable ?Wet prep unremarkable ?GC/chlamydia, HIV, syphilis ordered and results pending at time of discharge. ? ? ?Disposition: ?Patient presentation suspicious for possible exposure to STD.  At this time doubt bacterial vaginosis, trichomonas, acute cystitis.  Results pending for gonorrhea and chlamydia.  Discussed with patient that she will receive a phone call if  there are positive results as well as should be able to see it on her MyChart. After consideration of the diagnostic results and the patients response to treatment, I feel that the patient would benefit from Discharge home.  Also discussed with patient regarding her hearing voices that she may follow-up with [facility].  Feel that patient is safe for discharge at this time as she is without SI, HI, visual hallucinations or command auditory hallucinations. Pt advised on safe sex practices and understands that they have GC/Chlamydia cultures pending and will result in 2-3 days. Patient did not want additional testing at this time. Pt encouraged to follow up  at local health department for future STI checks. Supportive care measures and strict return precautions discussed with patient at bedside. Pt acknowledges and verbalizes understanding. Pt appears safe for discharge. Follow up as indicated in discharge paperwork.  ? ? ?This chart was dictated using voice recognition software, Dragon. Despite the best efforts of this provider to proofread and correct errors, errors may still occur which can change documentation meaning. ? ?Final Clinical Impression(s) / ED Diagnoses ?Final diagnoses:  ?Possible exposure to STD  ? ? ?Rx / DC Orders ?ED Discharge Orders   ? ? None  ? ?  ? ? ?  ?Zaara Sprowl A, PA-C ?07/16/21 1544 ? ?  ?Dorie Rank, MD ?07/17/21 1618 ? ?

## 2021-07-19 ENCOUNTER — Encounter (HOSPITAL_COMMUNITY): Payer: Self-pay | Admitting: Emergency Medicine

## 2021-07-19 ENCOUNTER — Emergency Department (HOSPITAL_COMMUNITY)
Admission: EM | Admit: 2021-07-19 | Discharge: 2021-07-19 | Disposition: A | Discharge status: Home / Self Care | Payer: Medicaid Other | Attending: Emergency Medicine | Admitting: Emergency Medicine

## 2021-07-19 ENCOUNTER — Other Ambulatory Visit: Payer: Self-pay

## 2021-07-19 DIAGNOSIS — Z202 Contact with and (suspected) exposure to infections with a predominantly sexual mode of transmission: Secondary | ICD-10-CM

## 2021-07-19 LAB — GC/CHLAMYDIA PROBE AMP (~~LOC~~) NOT AT ARMC
Chlamydia: NEGATIVE
Comment: NEGATIVE
Comment: NORMAL
Neisseria Gonorrhea: NEGATIVE

## 2021-07-19 MED ORDER — DOXYCYCLINE HYCLATE 100 MG PO TABS
100.0000 mg | ORAL_TABLET | Freq: Once | ORAL | Status: DC
Start: 2021-07-19 — End: 2021-07-19
  Filled 2021-07-19: qty 1

## 2021-07-19 MED ORDER — DOXYCYCLINE HYCLATE 100 MG PO CAPS: 100.0000 mg | ORAL_CAPSULE | Freq: Two times a day (BID) | ORAL | 0 refills | Status: AC

## 2021-07-19 MED ORDER — CEFTRIAXONE SODIUM 500 MG IJ SOLR
500.0000 mg | Freq: Once | INTRAMUSCULAR | Status: DC
  Filled 2021-07-19: qty 500

## 2021-07-19 MED ORDER — LIDOCAINE HCL (PF) 1 % IJ SOLN
INTRAMUSCULAR | Status: AC
  Filled 2021-07-19: qty 5

## 2021-07-19 NOTE — ED Notes (Signed)
Pt refused Rocephin IM injection. Pt stated that she knew that she did not need that. It was clearly explained to pt that the doxycycline alone would not treat the possible infection if STD testing came back positive.  ?

## 2021-07-19 NOTE — ED Triage Notes (Signed)
Pt states she was seen here recently and tested for STD. States she is here for follow-up of that visit as well as to have her prescription re-printed. ?

## 2021-07-19 NOTE — ED Notes (Signed)
Pt also refused her doxycyline stating that she knew that she didn't have any infection and left.  ?

## 2021-07-19 NOTE — ED Notes (Signed)
Pt refused vital signs before she left ? ?

## 2021-07-19 NOTE — ED Provider Notes (Signed)
? ?  [facility] EMERGENCY DEPARTMENT  ?Provider Note ? ?CSN: [id number] ?Arrival date & time: 07/19/21 0120 ? ?History ?No chief complaint on file. ? ? ?Annette Hurley is a 31 y.o. female was in the ED 3 days ago concerned about STI. She had labs done including GC/CT which has not yet resulted. She was not treated empirically then. She returns today hoping to get her lab results. No change in symptoms.  ? ? ?Home Medications ?Prior to Admission medications   ?Medication Sig Start Date End Date Taking? Authorizing Provider  ?doxycycline (VIBRAMYCIN) 100 MG capsule Take 1 capsule (100 mg total) by mouth 2 (two) times daily. 07/19/21  Yes Pollyann Savoy, MD  ?Vitamin D, Ergocalciferol, (DRISDOL) 1.25 MG (50000 UNIT) CAPS capsule Take 1 capsule (50,000 Units total) by mouth every 7 (seven) days. 04/09/20   Anders Simmonds, PA-C  ? ? ? ?Allergies    ?Other ? ? ?Review of Systems   ?Review of Systems ?Please see HPI for pertinent positives and negatives ? ?Physical Exam ?BP 103/75   Pulse 85   Temp 97.7 ?F (36.5 ?C)   Resp 18   SpO2 95%  ? ?Physical Exam ?Vitals and nursing note reviewed.  ?HENT:  ?   Head: Normocephalic.  ?   Nose: Nose normal.  ?Eyes:  ?   Extraocular Movements: Extraocular movements intact.  ?Pulmonary:  ?   Effort: Pulmonary effort is normal.  ?Musculoskeletal:     ?   General: Normal range of motion.  ?   Cervical back: Neck supple.  ?Skin: ?   Findings: No rash (on exposed skin).  ?Neurological:  ?   Mental Status: She is alert and oriented to person, place, and time.  ?Psychiatric:     ?   Mood and Affect: Mood normal.  ? ? ?ED Results / Procedures / Treatments   ?EKG ?None ? ?Procedures ?Procedures ? ?Medications Ordered in the ED ?Medications  ?cefTRIAXone (ROCEPHIN) injection 500 mg (has no administration in time range)  ?doxycycline (VIBRA-TABS) tablet 100 mg (has no administration in time range)  ? ? ?Initial Impression and Plan ? Patient advised that due to the holiday weekend her  GC/CT swab has not yet resulted. She was offered empiric treatment or to wait another 24 hours to see if it comes back. She would like empiric treatment.  ? ?ED Course  ? ?Clinical Course as of 07/19/21 0627  ?Mon Jul 19, 2021  ?0175 Per RN, patient refused Rocephin and doxycycline and decided to leave.  [CS]  ?  ?Clinical Course User Index ?[CS] Pollyann Savoy, MD  ? ? ? ?MDM Rules/Calculators/A&P ?Medical Decision Making ?Problems Addressed: ?Possible exposure to STD: acute illness or injury ? ?Risk ?Prescription drug management. ? ? ? ?Final Clinical Impression(s) / ED Diagnoses ?Final diagnoses:  ?Possible exposure to STD  ? ? ?Rx / DC Orders ?ED Discharge Orders   ? ?      Ordered  ?  doxycycline (VIBRAMYCIN) 100 MG capsule  2 times daily       ? 07/19/21 0603  ? ?  ?  ? ?  ? ?  ?Pollyann Savoy, MD ?07/19/21 [phone] ? ?

## 2021-07-31 ENCOUNTER — Encounter (HOSPITAL_COMMUNITY): Payer: Self-pay

## 2021-07-31 ENCOUNTER — Other Ambulatory Visit: Payer: Self-pay

## 2021-07-31 ENCOUNTER — Emergency Department (HOSPITAL_COMMUNITY)
Admission: EM | Admit: 2021-07-31 | Discharge: 2021-08-01 | Disposition: A | Discharge status: Home / Self Care | Payer: Medicaid Other | Attending: Emergency Medicine | Admitting: Emergency Medicine

## 2021-07-31 DIAGNOSIS — Z5321 Procedure and treatment not carried out due to patient leaving prior to being seen by health care provider: Secondary | ICD-10-CM | POA: Insufficient documentation

## 2021-07-31 DIAGNOSIS — R0989 Other specified symptoms and signs involving the circulatory and respiratory systems: Secondary | ICD-10-CM | POA: Insufficient documentation

## 2021-07-31 NOTE — ED Triage Notes (Signed)
Pt states she does not feel good. Endorses runny nose. Will not give much information. ?

## 2021-07-31 NOTE — ED Notes (Signed)
When asking pt if she is allergic to any medications pt replied "yes I am allergic to a lot of medications" when this RN asked what medications they were, pt would not provide any information. Pt then repeated "I am allergic to medications but I just need to breath." RN was not able to obtain list of these meds from pt. ?

## 2021-08-01 NOTE — ED Notes (Signed)
Pt left and does not want to be seen. Pt would not sign out ama ?

## 2021-08-23 ENCOUNTER — Emergency Department (HOSPITAL_COMMUNITY)
Admission: EM | Admit: 2021-08-23 | Discharge: 2021-08-23 | Discharge status: Against Medical Advice | Payer: Medicaid Other | Attending: Emergency Medicine | Admitting: Emergency Medicine

## 2021-08-23 ENCOUNTER — Other Ambulatory Visit: Payer: Self-pay

## 2021-08-23 ENCOUNTER — Encounter (HOSPITAL_COMMUNITY): Payer: Self-pay | Admitting: Emergency Medicine

## 2021-08-23 DIAGNOSIS — R1084 Generalized abdominal pain: Secondary | ICD-10-CM | POA: Diagnosis not present

## 2021-08-23 DIAGNOSIS — Z5329 Procedure and treatment not carried out because of patient's decision for other reasons: Secondary | ICD-10-CM | POA: Diagnosis not present

## 2021-08-23 DIAGNOSIS — I10 Essential (primary) hypertension: Secondary | ICD-10-CM | POA: Diagnosis not present

## 2021-08-23 DIAGNOSIS — R1110 Vomiting, unspecified: Secondary | ICD-10-CM | POA: Diagnosis not present

## 2021-08-23 DIAGNOSIS — J45909 Unspecified asthma, uncomplicated: Secondary | ICD-10-CM | POA: Diagnosis not present

## 2021-08-23 DIAGNOSIS — R109 Unspecified abdominal pain: Secondary | ICD-10-CM | POA: Diagnosis present

## 2021-08-23 LAB — URINALYSIS, ROUTINE W REFLEX MICROSCOPIC
Bilirubin Urine: NEGATIVE
Glucose, UA: NEGATIVE mg/dL
Hgb urine dipstick: NEGATIVE
Ketones, ur: NEGATIVE mg/dL
Leukocytes,Ua: NEGATIVE
Nitrite: NEGATIVE
Protein, ur: NEGATIVE mg/dL
Specific Gravity, Urine: 1.018 (ref 1.005–1.030)
pH: 7 (ref 5.0–8.0)

## 2021-08-23 LAB — PREGNANCY, URINE: Preg Test, Ur: NEGATIVE

## 2021-08-23 NOTE — ED Notes (Signed)
Ask patient what was going on she said "I already said I don't feel well, when ask how long stated several hours.  ?

## 2021-08-23 NOTE — ED Provider Notes (Signed)
?[facility] EMERGENCY DEPARTMENT ?Provider Note ? ? ?CSN: [id number] ?Arrival date & time: 08/23/21  0354 ? ?  ? ?History ? ?Chief Complaint  ?Patient presents with  ? Abdominal Pain  ? ? ?Annette Hurley is a 31 y.o. female. With past medical history of paranoid schizophrenia, asthma, HTN who presents to the emergency department with abdominal pain. ? ?Patient has very general and has difficulty elaborating on any part of the HPI.  She states that "all this is bothering me."  When probed further, she states that the hand sanitizer is bothering her making her not feel good.  I discussed the importance of hand hygiene and patient care.  She states that over the past day or 2 she has had sharp, intermittent abdominal pain with 1 episode of vomiting.  She denies any fevers, current nausea or diarrhea.  She denies any urinary symptoms, vaginal discharge.  She otherwise has difficulty elaborating ? ? ?Abdominal Pain ?Associated symptoms: vomiting   ?Associated symptoms: no dysuria, no fever, no nausea and no vaginal discharge   ? ?  ? ?Home Medications ?Prior to Admission medications   ?Medication Sig Start Date End Date Taking? Authorizing Provider  ?doxycycline (VIBRAMYCIN) 100 MG capsule Take 1 capsule (100 mg total) by mouth 2 (two) times daily. 07/19/21   Pollyann Savoy, MD  ?Vitamin D, Ergocalciferol, (DRISDOL) 1.25 MG (50000 UNIT) CAPS capsule Take 1 capsule (50,000 Units total) by mouth every 7 (seven) days. 04/09/20   Anders Simmonds, PA-C  ?   ? ?Allergies    ?Other   ? ?Review of Systems   ?Review of Systems  ?Constitutional:  Negative for fever.  ?Gastrointestinal:  Positive for abdominal pain and vomiting. Negative for nausea.  ?Genitourinary:  Negative for dysuria and vaginal discharge.  ?All other systems reviewed and are negative. ? ?Physical Exam ?Updated Vital Signs ?BP 96/73   Pulse 79   Temp 98.6 ?F (37 ?C) (Oral)   Resp 14   SpO2 100%  ?Physical Exam ?Vitals and nursing  note reviewed.  ?Constitutional:   ?   General: She is not in acute distress. ?   Appearance: She is well-developed and normal weight. She is not ill-appearing or toxic-appearing.  ?HENT:  ?   Head: Normocephalic and atraumatic.  ?Eyes:  ?   General: No scleral icterus. ?Pulmonary:  ?   Effort: Pulmonary effort is normal. No respiratory distress.  ?Abdominal:  ?   General: Abdomen is protuberant. Bowel sounds are normal.  ?   Palpations: Abdomen is soft.  ?   Tenderness: There is no abdominal tenderness.  ?Skin: ?   General: Skin is warm and dry.  ?   Capillary Refill: Capillary refill takes less than 2 seconds.  ?   Findings: No rash.  ?Neurological:  ?   General: No focal deficit present.  ?   Mental Status: She is alert.  ?Psychiatric:     ?   Mood and Affect: Mood normal.     ?   Behavior: Behavior normal.     ?   Thought Content: Thought content normal.     ?   Judgment: Judgment normal.  ? ? ?ED Results / Procedures / Treatments   ?Labs ?(all labs ordered are listed, but only abnormal results are displayed) ?Labs Reviewed  ?CBC WITH DIFFERENTIAL/PLATELET  ?COMPREHENSIVE METABOLIC PANEL  ?URINALYSIS, ROUTINE W REFLEX MICROSCOPIC  ?PREGNANCY, URINE  ? ?EKG ?None ? ?Radiology ?No results found. ? ?Procedures ?Procedures  ? ?  Medications Ordered in ED ?Medications - No data to display ? ?ED Course/ Medical Decision Making/ A&P ?  ?                        ?Medical Decision Making ?Amount and/or Complexity of Data Reviewed ?Labs: ordered. ? ?This patient presents to the ED for concern of abdominal pain, this involves an extensive number of treatment options, and is a complaint that carries with it a high risk of complications and morbidity.  The differential diagnosis includes Acute hepatobiliary disease, pancreatitis, appendicitis, PUD, gastritis, SBO, diverticulitis, colitis, viral gastroenteritis, Crohn's, UC, TOA, PID, ectopic, torsion ? ?Co morbidities that complicate the patient  evaluation ?schizophrenia ? ?Additional history obtained:  ?Additional history obtained from: none  ?External records from outside source obtained and reviewed including: previous ED visit physician note ? ?EKG: ?Not indicated  ? ?Cardiac Monitoring: ?Not indicated  ? ?Lab Results: ?I personally ordered, reviewed, and interpreted labs. ?Pertinent results include: ?CBC ?CMP ?UA negative ?Urine pregnancy negative  ? ?Imaging Studies ordered:  ?Not indicated  ? ?Medications  ?-I reviewed the patient's home medications and did not make adjustments. ?-I did not prescribe new home medications. ? ?Tests Considered: ?CT-A/P. Work-up unremarkable. Have very low suspicion for intraabdominal pathology.  ? ?Critical Interventions: ?None required ? ?Consultations: ?None required ? ?SDH ?None identified ? ?ED Course: ? ?31 year old female who presents emergency department for abdominal pain.  She has much difficulty elaborating on her symptoms.  She states that she has had intermittent abdominal pain when I ask.  She denies any urinary symptoms or vaginal discharge.  Denies any diarrhea.  Denies fevers. ?Physical exam unremarkable. ?Overall well-appearing and nontoxic in appearance, no acute distress.  She is not ill-appearing.  Throughout her conversation she sits on her phone and does not answer all of my questions. ? ?Able to get a urine sample. UA negative, urine pregnancy negative. ?I ordered labs. Per nursing, they attempted lab work twice and she pulled the needle out twice stating "they were doing it wrong."  ? ?I have spoken with the patient about this. Discussed that I need lab work to fully assess her symptoms. She refuses. I have discussed her signing out AMA since she does not want lab work, but I have not completed work up. She will sign out AMA. Overall, I have low suspicion for any intra-abdominal pathology. Her symptoms seem inconsistent with acute hepatobiliary disease, PUD, gastritis, pancreatitis,  appendicitis, colitis. She is not pregnant so doubt ectopic. UA negative. No UTI. Doubt pyelo. Not consistent with stone. No vaginal discharge, fever concerning for TOA, PID. ? ?Will discharge AMA.  ?The patient has decided to leave against medical advice.  ?The patient had a normal mental status examination and understands his/ ?her condition and the risks of leaving including worsening abdominal pain, worsening vomiting,  ?permanent disability and death. The patient has had an opportunity to ask  ?questions about his/her medical condition. The patient has been informed  ?that he/she may return for care at any time and has been referred to his/her  ?physician.  ? ?After consideration of the diagnostic results and the patients response to treatment, I feel that the patent would benefit from discharge AMA. ?The patient has been appropriately medically screened and/or stabilized in the ED. I have low suspicion for any other emergent medical condition which would require further screening, evaluation or treatment in the ED or require inpatient management. The patient is overall well appearing  and non-toxic in appearance. They are hemodynamically stable at time of discharge.   ?Final Clinical Impression(s) / ED Diagnoses ?Final diagnoses:  ?Generalized abdominal pain  ? ? ?Rx / DC Orders ?ED Discharge Orders   ? ? None  ? ?  ? ? ?  ?Cristopher PeruAutry, Aubreyana Saltz E, PA-C ?08/23/21 1008 ? ?  ?Tegeler, Canary Brimhristopher J, MD ?08/23/21 1421 ? ?

## 2021-08-23 NOTE — ED Provider Triage Note (Signed)
Emergency Medicine Provider Triage Evaluation Note ? ?Herbie Baltimore , a 31 y.o. female  was evaluated in triage.  Pt complains of "not feeling good".  Will not elaborate further but states it is because this provider used hand sanitizer prior to exam. ? ?Review of Systems  ?Positive: "Not feeling good" ?Negative: fever ? ?Physical Exam  ?BP 96/61   Pulse 82   Temp 98.6 ?F (37 ?C) (Oral)   Resp 16   SpO2 100%  ?Gen:   Awake, no distress   ?Resp:  Normal effort  ?MSK:   Moves extremities without difficulty  ?Other:   ? ?Medical Decision Making  ?Medically screening exam initiated at 4:18 AM.  Appropriate orders placed.  Sherhonda Gaspar was informed that the remainder of the evaluation will be completed by another provider, this initial triage assessment does not replace that evaluation, and the importance of remaining in the ED until their evaluation is complete. ? ?Reports "not feeling good".  Will not elaborate further, states it is because I used hand sanitizer prior to talking to her. ?  ?Garlon Hatchet, PA-C ?08/23/21 6834 ? ?

## 2021-08-23 NOTE — ED Notes (Signed)
When asked what medcations she was allergic to, patient stated "I am allergic to some medications".  When asked to be more specific, patient stated "I am allergic to some".   ?

## 2021-08-23 NOTE — Discharge Instructions (Signed)
Please establish a primary care provider to follow-up on your care. Return for abdominal pain with fever. Unable to drink fluids.  ?

## 2021-08-23 NOTE — ED Notes (Signed)
Patient is not answering questions during triage, patient is being very vague with answers.  Patient refusing to leave triage.   ?

## 2021-08-23 NOTE — ED Triage Notes (Signed)
Patient with abdominal pain, she states that the hand sanitizer smells bother her stomach.  She states that she does have some nausea and feels like she needs to vomit.  No diarrhea.   ?

## 2021-08-23 NOTE — ED Notes (Signed)
Attempted x 2 to get labs patient pulled the needle out x 2  PA aware ?

## 2023-10-02 ENCOUNTER — Encounter (HOSPITAL_COMMUNITY): Payer: Self-pay

## 2023-10-02 ENCOUNTER — Other Ambulatory Visit: Payer: Self-pay

## 2023-10-02 ENCOUNTER — Emergency Department (HOSPITAL_COMMUNITY)
Admission: EM | Admit: 2023-10-02 | Discharge: 2023-10-02 | Disposition: A | Discharge status: Home / Self Care | Attending: Emergency Medicine | Admitting: Emergency Medicine

## 2023-10-02 DIAGNOSIS — E860 Dehydration: Secondary | ICD-10-CM | POA: Insufficient documentation

## 2023-10-02 DIAGNOSIS — R42 Dizziness and giddiness: Secondary | ICD-10-CM | POA: Diagnosis present

## 2023-10-02 LAB — CBC
HCT: 38.4 % (ref 36.0–46.0)
Hemoglobin: 12.3 g/dL (ref 12.0–15.0)
MCH: 27.9 pg (ref 26.0–34.0)
MCHC: 32 g/dL (ref 30.0–36.0)
MCV: 87.1 fL (ref 80.0–100.0)
Platelets: 328 10*3/uL (ref 150–400)
RBC: 4.41 MIL/uL (ref 3.87–5.11)
RDW: 14.9 % (ref 11.5–15.5)
WBC: 4.2 10*3/uL (ref 4.0–10.5)
nRBC: 0 % (ref 0.0–0.2)

## 2023-10-02 LAB — COMPREHENSIVE METABOLIC PANEL WITH GFR
ALT: 15 U/L (ref 0–44)
AST: 15 U/L (ref 15–41)
Albumin: 3.4 g/dL — ABNORMAL LOW (ref 3.5–5.0)
Alkaline Phosphatase: 47 U/L (ref 38–126)
Anion gap: 9 (ref 5–15)
BUN: 11 mg/dL (ref 6–20)
CO2: 28 mmol/L (ref 22–32)
Calcium: 8.9 mg/dL (ref 8.9–10.3)
Chloride: 102 mmol/L (ref 98–111)
Creatinine, Ser: 0.89 mg/dL (ref 0.44–1.00)
GFR, Estimated: >60 mL/min (ref >60)
Glucose, Bld: 103 mg/dL — ABNORMAL HIGH (ref 70–99)
Potassium: 3.9 mmol/L (ref 3.5–5.1)
Sodium: 139 mmol/L (ref 135–145)
Total Bilirubin: 0.7 mg/dL (ref 0.0–1.2)
Total Protein: 7.3 g/dL (ref 6.5–8.1)

## 2023-10-02 LAB — CBG MONITORING, ED: Glucose-Capillary: 105 mg/dL — ABNORMAL HIGH (ref 70–99)

## 2023-10-02 LAB — URINALYSIS, ROUTINE W REFLEX MICROSCOPIC
Bilirubin Urine: NEGATIVE
Glucose, UA: NEGATIVE mg/dL
Hgb urine dipstick: NEGATIVE
Ketones, ur: NEGATIVE mg/dL
Leukocytes,Ua: NEGATIVE
Nitrite: NEGATIVE
Protein, ur: NEGATIVE mg/dL
Specific Gravity, Urine: 1.012 (ref 1.005–1.030)
pH: 6 (ref 5.0–8.0)

## 2023-10-02 LAB — PREGNANCY, URINE: Preg Test, Ur: NEGATIVE

## 2023-10-02 MED ORDER — LACTATED RINGERS IV BOLUS
1000.0000 mL | Freq: Once | INTRAVENOUS | Status: AC
  Administered 2023-10-02: 1000 mL via INTRAVENOUS

## 2023-10-02 NOTE — ED Provider Notes (Signed)
 [place] [facility] AT [facility] Provider Note   CSN: [id number] Arrival date & time: 10/02/23  1254     Patient presents with: Loss of Consciousness   Annette Hurley is a 33 y.o. female.   HPI Patient presents from incarceration with concern for lightheadedness/near syncope. Patient has a history of anxiety, takes Abilify, otherwise no substantial medication use. Over the past week patient has had multiple episodes of lightheadedness, near syncope, no chest pain, no actual syncope, fall. Per report and the patient herself she has not been eating or drinking as much as she normally does.  She is here with a [employer] cabin crew who assists with the history.    Prior to Admission medications   Medication Sig Start Date End Date Taking? Authorizing Provider  ARIPiprazole (ABILIFY) 20 MG tablet Take 20 mg by mouth daily.    [provider]  doxycycline  (VIBRAMYCIN ) 100 MG capsule Take 1 capsule (100 mg total) by mouth 2 (two) times daily. 07/19/21   Roselyn Carlin NOVAK, MD  Multiple Vitamin (DAILY-VITE) TABS Take 1 tablet by mouth daily. 09/15/23   [provider]  Vitamin D , Ergocalciferol , (DRISDOL ) 1.25 MG (50000 UNIT) CAPS capsule Take 1 capsule (50,000 Units total) by mouth every 7 (seven) days. 04/09/20   McClung, Angela M, PA-C    Allergies: Other    Review of Systems  Updated Vital Signs BP 106/68   Pulse 65   Temp 98.6 F (37 C)   Resp 18   Ht 5' 4 (1.626 m)   Wt 78 kg   LMP 09/30/2023 (Exact Date)   SpO2 96%   BMI 29.52 kg/m   Physical Exam Vitals and nursing note reviewed.  Constitutional:      General: She is not in acute distress.    Appearance: She is well-developed.  HENT:     Head: Normocephalic and atraumatic.   Eyes:     Conjunctiva/sclera: Conjunctivae normal.    Cardiovascular:     Rate and Rhythm: Normal rate and regular rhythm.  Pulmonary:     Effort: Pulmonary effort is normal. No respiratory  distress.     Breath sounds: Normal breath sounds. No stridor.  Abdominal:     General: There is no distension.   Skin:    General: Skin is warm and dry.   Neurological:     Mental Status: She is alert and oriented to person, place, and time.     Cranial Nerves: No cranial nerve deficit.   Psychiatric:        Mood and Affect: Mood normal.     (all labs ordered are listed, but only abnormal results are displayed) Labs Reviewed  COMPREHENSIVE METABOLIC PANEL WITH GFR - Abnormal; Notable for the following components:      Result Value   Glucose, Bld 103 (*)    Albumin 3.4 (*)    All other components within normal limits  URINALYSIS, ROUTINE W REFLEX MICROSCOPIC - Abnormal; Notable for the following components:   APPearance HAZY (*)    All other components within normal limits  CBG MONITORING, ED - Abnormal; Notable for the following components:   Glucose-Capillary 105 (*)    All other components within normal limits  CBC  PREGNANCY, URINE    EKG: EKG Interpretation Date/Time:  Monday October 02 2023 14:20:10 EDT Ventricular Rate:  72 PR Interval:  123 QRS Duration:  83 QT Interval:  368 QTC Calculation: 403 R Axis:   73  Text Interpretation:  Sinus rhythm Confirmed by Garrick Charleston [id number]) on 10/02/2023 2:59:47 PM  Radiology: No results found.   Procedures   Medications Ordered in the ED  lactated ringers bolus 1,000 mL (0 mLs Intravenous Stopped 10/02/23 1602)                                    Medical Decision Making Adult female presents with concern for recurrent near syncope/lightheadedness. Patient is awake, alert, providing own history, and has no neurodeficits reassuring for low suspicion of intracranial abnormality.  Concern for cardiogenic versus vasogenic or volume related complications. Cardiac 85 sinus normal pulse ox 100% room air normal  Amount and/or Complexity of Data Reviewed Independent Historian:     Details: Present staff Labs: ordered.  Decision-making details documented in ED Course. Radiology: ordered and independent interpretation performed. Decision-making details documented in ED Course. ECG/medicine tests: ordered and independent interpretation performed. Decision-making details documented in ED Course.   4:03 PM Patient in no distress, awake, alert, hemodynamically essentially the same she has received fluid resuscitation has had no new complaints, no arrhythmia on monitor, no chest pain, findings consistent with mild dehydration given her description of decreased p.o. intake for the past few days.  Patient will follow-up with primary care.   Final diagnoses:  Dehydration  Lightheadedness    ED Discharge Orders     None          Garrick Charleston, MD 10/02/23 [phone]

## 2023-10-02 NOTE — Discharge Instructions (Signed)
 As discussed, your evaluation today has been largely reassuring.  But, it is important that you monitor your condition carefully, and do not hesitate to return to the ED if you develop new, or concerning changes in your condition. ? ?Otherwise, please follow-up with your physician for appropriate ongoing care. ? ?

## 2023-10-02 NOTE — ED Triage Notes (Signed)
 [facility] Pt presents today for evaluation of dizziness and syncopal episode last night. Pt endorses N/V X 2 today. Pt does endorse random dizzy spells for past week, and has been diaphoretic. Pt does report skipping meals and not hydrating like she should.
